# Patient Record
Sex: Male | Born: 2000
Health system: Southern US, Community
[De-identification: ages and names within clinical notes are randomized; demographics above are authoritative.]

## PROBLEM LIST (undated history)

## (undated) DIAGNOSIS — D6851 Activated protein C resistance: Secondary | ICD-10-CM

## (undated) DIAGNOSIS — B2790 Infectious mononucleosis, unspecified without complication: Secondary | ICD-10-CM

## (undated) HISTORY — DX: Infectious mononucleosis, unspecified without complication: B27.90

## (undated) HISTORY — PX: WISDOM TOOTH EXTRACTION: SHX21

---
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
## 2018-03-11 ENCOUNTER — Telehealth: Payer: Self-pay | Admitting: Physician Assistant

## 2018-03-11 NOTE — Telephone Encounter (Signed)
See note

## 2018-03-11 NOTE — Telephone Encounter (Signed)
Copied from CRM [phone]. Topic: Quick Communication - See Telephone Encounter >> Mar 11, 2018  3:57 PM Eston Mouldavis, Nnaemeka Samson B wrote: CRM for notification. See Telephone encounter for: 03/11/18.  Pt would like to discuss lab results form July of 2018, he thinks there was a miscommunication.

## 2018-03-12 NOTE — Telephone Encounter (Signed)
Pt called back, wanted to have clarification on lab results from last July regarding wart that was removed. Told pt the biopsy showed  a "verrucous epidermal hyperplasia" which is a common name for a wart and was HPV Negative. Pt verbalized understanding and said he had a physical were he is and was told he had HPV. Told pt the wart we removed was HPV negative. Pt verbalized understanding.

## 2018-03-12 NOTE — Telephone Encounter (Signed)
Left message on voicemail to call office.  

## 2018-04-21 DIAGNOSIS — B2790 Infectious mononucleosis, unspecified without complication: Secondary | ICD-10-CM

## 2018-04-21 HISTORY — DX: Infectious mononucleosis, unspecified without complication: B27.90

## 2018-04-30 DIAGNOSIS — L700 Acne vulgaris: Secondary | ICD-10-CM | POA: Diagnosis not present

## 2018-04-30 DIAGNOSIS — B078 Other viral warts: Secondary | ICD-10-CM | POA: Diagnosis not present

## 2018-04-30 DIAGNOSIS — A630 Anogenital (venereal) warts: Secondary | ICD-10-CM | POA: Diagnosis not present

## 2018-05-02 DIAGNOSIS — H6980 Other specified disorders of Eustachian tube, unspecified ear: Secondary | ICD-10-CM | POA: Diagnosis not present

## 2018-05-02 DIAGNOSIS — J039 Acute tonsillitis, unspecified: Secondary | ICD-10-CM | POA: Diagnosis not present

## 2018-05-02 DIAGNOSIS — B2790 Infectious mononucleosis, unspecified without complication: Secondary | ICD-10-CM | POA: Diagnosis not present

## 2018-05-05 ENCOUNTER — Other Ambulatory Visit: Payer: BLUE CROSS/BLUE SHIELD

## 2018-05-05 ENCOUNTER — Ambulatory Visit (INDEPENDENT_AMBULATORY_CARE_PROVIDER_SITE_OTHER): Payer: BLUE CROSS/BLUE SHIELD | Admitting: Physician Assistant

## 2018-05-05 ENCOUNTER — Encounter: Payer: Self-pay | Admitting: Physician Assistant

## 2018-05-05 VITALS — BP 120/80 | HR 105 | Temp 98.4°F | Ht 71.0 in | Wt 169.2 lb

## 2018-05-05 DIAGNOSIS — J029 Acute pharyngitis, unspecified: Secondary | ICD-10-CM

## 2018-05-05 DIAGNOSIS — R799 Abnormal finding of blood chemistry, unspecified: Secondary | ICD-10-CM

## 2018-05-05 LAB — CBC WITH DIFFERENTIAL/PLATELET
BASOS ABS: 0 10*3/uL (ref 0.0–0.1)
Basophils Relative: 0.3 % (ref 0.0–3.0)
EOS ABS: 0 10*3/uL (ref 0.0–0.7)
Eosinophils Relative: 0.1 % (ref 0.0–5.0)
HCT: 48.6 % (ref 36.0–49.0)
Hemoglobin: 17.5 g/dL — ABNORMAL HIGH (ref 12.0–16.0)
LYMPHS ABS: 6.2 10*3/uL — AB (ref 0.7–4.0)
Lymphocytes Relative: 46.3 % (ref 24.0–48.0)
MCHC: 36.1 g/dL (ref 31.0–37.0)
MCV: 88.7 fl (ref 78.0–98.0)
MONO ABS: 0.8 10*3/uL (ref 0.1–1.0)
Monocytes Relative: 6 % (ref 3.0–12.0)
NEUTROS PCT: 47.3 % (ref 43.0–71.0)
Neutro Abs: 6.3 10*3/uL (ref 1.4–7.7)
Platelets: 247 10*3/uL (ref 150.0–575.0)
RBC: 5.48 Mil/uL (ref 3.80–5.70)
RDW: 12.6 % (ref 11.4–15.5)
WBC: 13.4 10*3/uL (ref 4.5–13.5)

## 2018-05-05 MED ORDER — PREDNISONE 20 MG PO TABS: 40.0000 mg | ORAL_TABLET | Freq: Every day | ORAL | 0 refills | Status: DC

## 2018-05-05 MED ORDER — CLINDAMYCIN HCL 300 MG PO CAPS: 300.0000 mg | ORAL_CAPSULE | Freq: Three times a day (TID) | ORAL | 0 refills | Status: DC

## 2018-05-05 NOTE — Progress Notes (Signed)
Keith Harrison is a 17 y.o. male here for a new problem.  I acted as a Neurosurgeon for [employer], PA-C Corky Mull, LPN  History of Present Illness:   Chief Complaint  Patient presents with  . Sore Throat   Patient is present with mom today.  Sore Throat   This is a new problem. Episode onset: Started 28th of April saw Dr. on school campus given Amoxicillin and Flonase . Took antibiotic for few days and was not getting any better and went to Urgent Care on Sunday 5/12 and was diagnosed with Mono. The problem has been gradually worsening. The maximum temperature recorded prior to his arrival was 100.4 - 100.9 F. The fever has been present for 3 to 4 days. The pain is at a severity of 4/10. The pain is moderate. Associated symptoms include congestion (Nasal, clear drainage), coughing, a hoarse voice, a plugged ear sensation, neck pain and trouble swallowing. Pertinent negatives include no diarrhea, headaches or shortness of breath. He has had exposure to mono. He has tried acetaminophen and cool liquids (Magic Mouthwash, Prednisone) for the symptoms. The treatment provided moderate relief.   He is concerned that his symptoms are not improving. His throat is very painful and he is having to eat soft foods.  Past Medical History:  Diagnosis Date  . Factor 5 Leiden mutation, heterozygous [facility])      Social History   Socioeconomic History  . Marital status: Single    Spouse name: Not on file  . Number of children: Not on file  . Years of education: Not on file  . Highest education level: Not on file  Occupational History  . Not on file  Social Needs  . Financial resource strain: Not on file  . Food insecurity:    Worry: Not on file    Inability: Not on file  . Transportation needs:    Medical: Not on file    Non-medical: Not on file  Tobacco Use  . Smoking status: Never Smoker  . Smokeless tobacco: Never Used  Substance and Sexual Activity  . Alcohol use: No  . Drug use:  No  . Sexual activity: Yes    Partners: Female  Lifestyle  . Physical activity:    Days per week: Not on file    Minutes per session: Not on file  . Stress: Not on file  Relationships  . Social connections:    Talks on phone: Not on file    Gets together: Not on file    Attends religious service: Not on file    Active member of club or organization: Not on file    Attends meetings of clubs or organizations: Not on file    Relationship status: Not on file  . Intimate partner violence:    Fear of current or ex partner: Not on file    Emotionally abused: Not on file    Physically abused: Not on file    Forced sexual activity: Not on file  Other Topics Concern  . Not on file  Social History Narrative   Goes to [employer]   Has a sister    Past Surgical History:  Procedure Laterality Date  . WISDOM TOOTH EXTRACTION      History reviewed. No pertinent family history.  No Known Allergies  Current Medications:   Current Outpatient Medications:  .  acetaminophen (TYLENOL) 500 MG tablet, Take 1,000 mg by mouth every 6 (six) hours as needed., Disp: , Rfl:  .  fluticasone (FLONASE) 50 MCG/ACT nasal spray, Place 1 spray into both nostrils daily., Disp: , Rfl:  .  magic mouthwash SOLN, SWISH AND SWALLOW 5 MILLILITERS BY MOUTH 4 TIMES A DAY BEFORE MEALS, Disp: , Rfl: 0 .  clindamycin (CLEOCIN) 300 MG capsule, Take 1 capsule (300 mg total) by mouth 3 (three) times daily., Disp: 30 capsule, Rfl: 0 .  predniSONE (DELTASONE) 20 MG tablet, Take 2 tablets (40 mg total) by mouth daily., Disp: 10 tablet, Rfl: 0   Review of Systems:   Review of Systems  HENT: Positive for congestion (Nasal, clear drainage), hoarse voice and trouble swallowing.   Respiratory: Positive for cough. Negative for shortness of breath.   Gastrointestinal: Negative for diarrhea.  Musculoskeletal: Positive for neck pain.  Neurological: Negative for headaches.    Vitals:   Vitals:   05/05/18 1348  BP: 120/80   Pulse: (!) 105  Temp: 98.4 F (36.9 C)  TempSrc: Oral  SpO2: 96%  Weight: 169 lb 4 oz (76.8 kg)  Height:  (1.803 m)     Body mass index is 23.61 kg/m.  Physical Exam:   Physical Exam  Constitutional: He appears well-developed. He is cooperative.  Non-toxic appearance. He does not have a sickly appearance. He does not appear ill. No distress.  HENT:  Head: Normocephalic and atraumatic.  Right Ear: Tympanic membrane, external ear and ear canal normal. Tympanic membrane is not erythematous, not retracted and not bulging.  Left Ear: Tympanic membrane, external ear and ear canal normal. Tympanic membrane is not erythematous, not retracted and not bulging.  Nose: Nose normal. Right sinus exhibits no maxillary sinus tenderness and no frontal sinus tenderness. Left sinus exhibits no maxillary sinus tenderness and no frontal sinus tenderness.  Mouth/Throat: Uvula is midline and mucous membranes are normal. Posterior oropharyngeal erythema present. No posterior oropharyngeal edema. Tonsils are 3+ on the right. Tonsils are 3+ on the left. Tonsillar exudate.  Eyes: Conjunctivae and lids are normal.  Neck: Trachea normal.  Cardiovascular: Regular rhythm, S1 normal, S2 normal and normal heart sounds. Tachycardia present.  Pulmonary/Chest: Effort normal and breath sounds normal. He has no decreased breath sounds. He has no wheezes. He has no rhonchi. He has no rales.  Lymphadenopathy:    He has cervical adenopathy.  Neurological: He is alert.  Skin: Skin is warm, dry and intact.  Psychiatric: He has a normal mood and affect. His speech is normal and behavior is normal.  Nursing note and vitals reviewed.   Assessment and Plan:    Toby was seen today for sore throat.  Diagnoses and all orders for this visit:  Pharyngitis, unspecified etiology Discussed case with [name]. Will treat exudative tonsillitis with Clindamycin per orders. Push fluids. Increase prednisone to 40 mg  daily x 5 days. Follow-up on Friday for re-evaluation of throat. Follow-up sooner if other concerns. Mom is requesting mono testing -- will draw today as well as CBC w/ diff.  -     CBC with Differential/Platelet -     Epstein-Barr virus VCA antibody panel  Other orders -     clindamycin (CLEOCIN) 300 MG capsule; Take 1 capsule (300 mg total) by mouth 3 (three) times daily. -     predniSONE (DELTASONE) 20 MG tablet; Take 2 tablets (40 mg total) by mouth daily.    . Reviewed expectations re: course of current medical issues. . Discussed self-management of symptoms. . Outlined signs and symptoms indicating need for more acute intervention. . Patient  verbalized understanding and all questions were answered. . See orders for this visit as documented in the electronic medical record. . Patient received an After-Visit Summary.  CMA or LPN served as scribe during this visit. History, Physical, and Plan performed by medical provider. Documentation and orders reviewed and attested to.  Jarold Motto, PA-C

## 2018-05-05 NOTE — Patient Instructions (Addendum)
Take 20 mg prednisone in morning and 20 mg prednisone in afternoon.  Push fluids.  Tylenol for fever if needed.  Start clindamycin antibiotic.   Follow-up with [name] on Friday to re-evaluate your throat.   Please return if you are not improving as expected, or if you have high fevers (>101.5) or difficulty swallowing or worsening productive cough.  Call clinic with questions.  I hope you start feeling better soon!

## 2018-05-06 LAB — EPSTEIN-BARR VIRUS VCA ANTIBODY PANEL
EBV NA IgG: <18 U/mL
EBV VCA IgG: 81.6 U/mL — ABNORMAL HIGH
EBV VCA IgM: >160 U/mL — ABNORMAL HIGH

## 2018-05-06 LAB — PATHOLOGIST SMEAR REVIEW

## 2018-05-10 ENCOUNTER — Encounter: Payer: Self-pay | Admitting: Hematology

## 2018-05-10 ENCOUNTER — Ambulatory Visit: Payer: BLUE CROSS/BLUE SHIELD | Admitting: Physician Assistant

## 2018-05-10 ENCOUNTER — Telehealth: Payer: Self-pay | Admitting: Hematology

## 2018-05-10 ENCOUNTER — Encounter: Payer: Self-pay | Admitting: Physician Assistant

## 2018-05-10 VITALS — BP 110/64 | HR 65 | Temp 97.8°F | Ht 71.0 in | Wt 170.2 lb

## 2018-05-10 DIAGNOSIS — D582 Other hemoglobinopathies: Secondary | ICD-10-CM | POA: Diagnosis not present

## 2018-05-10 DIAGNOSIS — D6851 Activated protein C resistance: Secondary | ICD-10-CM | POA: Diagnosis not present

## 2018-05-10 DIAGNOSIS — B2790 Infectious mononucleosis, unspecified without complication: Secondary | ICD-10-CM | POA: Diagnosis not present

## 2018-05-10 NOTE — Telephone Encounter (Signed)
Referral from Dr. Bufford Buttner at [facility] for a dx of Factor V Leiden.   Tc has been made to the pt to see Dr. Candise Che on 5/29 at 10am. Letter mailed to the pt.

## 2018-05-10 NOTE — Patient Instructions (Signed)
You will be contacted about your appointment to hematology.

## 2018-05-10 NOTE — Progress Notes (Signed)
Keith Harrison is a 17 y.o. male is here to discuss: Sore throat  I acted as a Neurosurgeon for [employer], PA-C Corky Mull, LPN  History of Present Illness:   Chief Complaint  Patient presents with  . Sore Throat    Sore Throat   Chronicity: Pt here to follow up, feeling much better. The problem has been resolved. There has been no fever. The pain is at a severity of 0/10. Associated symptoms comments: Fatigue. He has tried acetaminophen (Magic Mouthwash used few times) for the symptoms. The treatment provided significant relief.   We reviewed his labs today from his prior visit.  His labs did confirm that he has an active mono infection.  He also has an elevated hemoglobin it is currently 17.5 he reports that he is not a smoker.  He does have a history of factor V.  States that his factor V was diagnosed when he was very young and he does not have any current follow-up or management for this chronic medical condition.  There are no preventive care reminders to display for this patient.  Past Medical History:  Diagnosis Date  . Factor 5 Leiden mutation, heterozygous [facility])      Social History   Socioeconomic History  . Marital status: Single    Spouse name: Not on file  . Number of children: Not on file  . Years of education: Not on file  . Highest education level: Not on file  Occupational History  . Not on file  Social Needs  . Financial resource strain: Not on file  . Food insecurity:    Worry: Not on file    Inability: Not on file  . Transportation needs:    Medical: Not on file    Non-medical: Not on file  Tobacco Use  . Smoking status: Never Smoker  . Smokeless tobacco: Never Used  Substance and Sexual Activity  . Alcohol use: No  . Drug use: No  . Sexual activity: Yes    Partners: Female  Lifestyle  . Physical activity:    Days per week: Not on file    Minutes per session: Not on file  . Stress: Not on file  Relationships  . Social connections:      Talks on phone: Not on file    Gets together: Not on file    Attends religious service: Not on file    Active member of club or organization: Not on file    Attends meetings of clubs or organizations: Not on file    Relationship status: Not on file  . Intimate partner violence:    Fear of current or ex partner: Not on file    Emotionally abused: Not on file    Physically abused: Not on file    Forced sexual activity: Not on file  Other Topics Concern  . Not on file  Social History Narrative   Goes to [employer]   Has a sister    Past Surgical History:  Procedure Laterality Date  . WISDOM TOOTH EXTRACTION      History reviewed. No pertinent family history.  PMHx, SurgHx, SocialHx, FamHx, Medications, and Allergies were reviewed in the Visit Navigator and updated as appropriate.   Patient Active Problem List   Diagnosis Date Noted  . Factor 5 Leiden mutation, heterozygous (HCC) 02/13/2017    Social History   Tobacco Use  . Smoking status: Never Smoker  . Smokeless tobacco: Never Used  Substance Use Topics  .  Alcohol use: No  . Drug use: No    Current Medications and Allergies:    Current Outpatient Medications:  .  acetaminophen (TYLENOL) 500 MG tablet, Take 1,000 mg by mouth every 6 (six) hours as needed., Disp: , Rfl:  .  clindamycin (CLEOCIN) 300 MG capsule, Take 1 capsule (300 mg total) by mouth 3 (three) times daily., Disp: 30 capsule, Rfl: 0 .  fluticasone (FLONASE) 50 MCG/ACT nasal spray, Place 1 spray into both nostrils daily., Disp: , Rfl:  .  predniSONE (DELTASONE) 20 MG tablet, Take 2 tablets (40 mg total) by mouth daily., Disp: 10 tablet, Rfl: 0 .  magic mouthwash SOLN, SWISH AND SWALLOW 5 MILLILITERS BY MOUTH 4 TIMES A DAY BEFORE MEALS, Disp: , Rfl: 0  No Known Allergies  Review of Systems   ROS  Negative unless otherwise specified per HPI.  Vitals:   Vitals:   05/10/18 0818  BP: 110/64  Pulse: 65  Temp: 97.8 F (36.6 C)  TempSrc: Oral   SpO2: 97%  Weight: 170 lb 4 oz (77.2 kg)  Height:  (1.803 m)     Body mass index is 23.75 kg/m.   Physical Exam:    Physical Exam  Constitutional: He appears well-developed. He is cooperative.  Non-toxic appearance. He does not have a sickly appearance. He does not appear ill. No distress.  HENT:  Head: Normocephalic and atraumatic.  Right Ear: Tympanic membrane, external ear and ear canal normal. Tympanic membrane is not erythematous, not retracted and not bulging.  Left Ear: Tympanic membrane, external ear and ear canal normal. Tympanic membrane is not erythematous, not retracted and not bulging.  Nose: Nose normal. Right sinus exhibits no maxillary sinus tenderness and no frontal sinus tenderness. Left sinus exhibits no maxillary sinus tenderness and no frontal sinus tenderness.  Mouth/Throat: Uvula is midline and mucous membranes are normal. No posterior oropharyngeal edema or posterior oropharyngeal erythema. Tonsils are 1+ on the right. Tonsils are 1+ on the left.  Eyes: Conjunctivae and lids are normal.  Neck: Trachea normal.  Cardiovascular: Normal rate, regular rhythm, S1 normal, S2 normal and normal heart sounds.  Pulmonary/Chest: Effort normal and breath sounds normal. He has no decreased breath sounds. He has no wheezes. He has no rhonchi. He has no rales.  Lymphadenopathy:    He has no cervical adenopathy.  Neurological: He is alert.  Skin: Skin is warm, dry and intact.  Psychiatric: He has a normal mood and affect. His speech is normal and behavior is normal.  Nursing note and vitals reviewed.  Results for orders placed or performed in visit on 05/05/18  Pathologist smear review  Result Value Ref Range   Path Review     CBC    Component Value Date/Time   WBC 13.4 05/05/2018 1418   RBC 5.48 05/05/2018 1418   HGB 17.5 Repeated and verified X2. (H) 05/05/2018 1418   HCT 48.6 05/05/2018 1418   PLT 247.0 05/05/2018 1418   MCV 88.7 05/05/2018 1418   MCHC  36.1 05/05/2018 1418   RDW 12.6 05/05/2018 1418   LYMPHSABS 6.2 (H) 05/05/2018 1418   MONOABS 0.8 05/05/2018 1418   EOSABS 0.0 05/05/2018 1418   BASOSABS 0.0 05/05/2018 1418      Assessment and Plan:    Myshawn was seen today for sore throat.  Diagnoses and all orders for this visit:  Elevated hemoglobin (HCC) and Factor 5 Leiden mutation, heterozygous [facility]) I reviewed lab results with [name].  Given his  elevated hemoglobin, and history of factor V, we will go ahead and send to hematology for further evaluation.  Patient is agreeable to this. -     Ambulatory referral to Hematology  Infectious mononucleosis without complication, infectious mononucleosis due to unspecified organism His symptoms, in regards to his pharyngitis, are essentially resolved.  He continues to have lingering fatigue.  We discussed need for rest and avoidance of any high impact sports or grueling activities for the next few weeks.  Patient verbalized understanding.  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  Follow-up if symptoms worsen.   . Reviewed expectations re: course of current medical issues. . Discussed self-management of symptoms. . Outlined signs and symptoms indicating need for more acute intervention. . Patient verbalized understanding and all questions were answered. . See orders for this visit as documented in the electronic medical record. . Patient received an After Visit Summary.  CMA or LPN served as scribe during this visit. History, Physical, and Plan performed by medical provider. Documentation and orders reviewed and attested to.  Jarold Motto, PA-C [place] [place] 05/10/2018  Follow-up: No follow-ups on file.

## 2018-05-14 NOTE — Progress Notes (Signed)
HEMATOLOGY/ONCOLOGY CONSULTATION NOTE  Date of Service: 05/19/2018  Patient Care Team: Jarold Motto, [place] as PCP - General (Physician Assistant)  CHIEF COMPLAINTS/PURPOSE OF CONSULTATION:  Factor V Leiden Polycythemia  HISTORY OF PRESENTING ILLNESS:   Keith Harrison is a wonderful 17 y.o. male who has been referred to [name] by Jarold Motto, PA for evaluation and management of heterozygous Factor V Leiden and polycythemia.   He is accompanied today by his mother and step-father. The pt reports that he is doing well overall.   The pt reports being tested for Factor V Leiden when he was an infant and was found to be heterozygous Factor V Leiden. He had recent concerns of SOB for which he saw his PCP Jarold Motto, PA yesterday and was worked up with a CTA Chest. He notes that he recently had infectious mononucleosis and has been more dehydrated. He notes that his mono began around April 28 and resolved about a week ago.   The patient's mother notes that she is heterozygous for Factor V Leiden. She used Lovenox throughout the entire pregnancy and has had clots before and after her pregnancy. She notes oral contraceptive use in the setting of her blood clots, and has not had a clot in 12-14 years since discontinuing estrogen contraceptives.   Of note prior to the patient's visit today, pt has had CT Angio Chest completed on 05/18/18 with results revealing Normal CTA chest. Negative for pulmonary embolism.   Most recent lab results (05/05/18) of CBC  is as follows: all values are WNL except for Hgb at 17.5, Lymphs Abs at 6.2k.   On review of systems, pt reports resolving viral symptoms, and denies skin rashes, pain along the spine, abdominal pains, leg swelling, and any other symptoms.   On PMHx the pt denies blood clots and any significant concerns. On Social Hx the pt denies ever smoking On Family Hx the pt reports maternal Factor V Leiden and blood clots, as well as paternal  grandfather blood clot. The pt reports paternal Factor V Leiden.    MEDICAL HISTORY:  Past Medical History:  Diagnosis Date  . Factor 5 Leiden mutation, heterozygous [facility])     SURGICAL HISTORY: Past Surgical History:  Procedure Laterality Date  . WISDOM TOOTH EXTRACTION      SOCIAL HISTORY: Social History   Socioeconomic History  . Marital status: Single    Spouse name: Not on file  . Number of children: Not on file  . Years of education: Not on file  . Highest education level: Not on file  Occupational History  . Not on file  Social Needs  . Financial resource strain: Not on file  . Food insecurity:    Worry: Not on file    Inability: Not on file  . Transportation needs:    Medical: Not on file    Non-medical: Not on file  Tobacco Use  . Smoking status: Never Smoker  . Smokeless tobacco: Never Used  Substance and Sexual Activity  . Alcohol use: No  . Drug use: No  . Sexual activity: Yes    Partners: Female  Lifestyle  . Physical activity:    Days per week: Not on file    Minutes per session: Not on file  . Stress: Not on file  Relationships  . Social connections:    Talks on phone: Not on file    Gets together: Not on file    Attends religious service: Not on file  Active member of club or organization: Not on file    Attends meetings of clubs or organizations: Not on file    Relationship status: Not on file  . Intimate partner violence:    Fear of current or ex partner: Not on file    Emotionally abused: Not on file    Physically abused: Not on file    Forced sexual activity: Not on file  Other Topics Concern  . Not on file  Social History Narrative   Goes to [employer]   Has a sister    FAMILY HISTORY: History reviewed. No pertinent family history.  ALLERGIES:  has No Known Allergies.  MEDICATIONS:  No current outpatient medications on file.   No current facility-administered medications for this visit.     REVIEW OF SYSTEMS:    10  Point review of Systems was done is negative except as noted above.  PHYSICAL EXAMINATION:  . Vitals:   05/19/18 1118  BP: 126/77  Pulse: 69  Resp: 16  Temp: 98.5 F (36.9 C)  SpO2: 99%   Filed Weights   05/19/18 1118  Weight: 170 lb 6.4 oz (77.3 kg)   .Body mass index is 23.77 kg/m.  GENERAL:alert, in no acute distress and comfortable SKIN: no acute rashes, no significant lesions EYES: conjunctiva are pink and non-injected, sclera anicteric OROPHARYNX: MMM, no exudates, no oropharyngeal erythema or ulceration NECK: supple, no JVD LYMPH:  no palpable lymphadenopathy in the cervical, axillary or inguinal regions LUNGS: clear to auscultation b/l with normal respiratory effort HEART: regular rate & rhythm ABDOMEN:  normoactive bowel sounds , non tender, not distended. Extremity: no pedal edema PSYCH: alert & oriented x 3 with fluent speech NEURO: no focal motor/sensory deficits  LABORATORY DATA:  I have reviewed the data as listed  . CBC Latest Ref Rng & Units 05/21/2018 05/05/2018  WBC 4.0 - 10.3 K/uL 4.9 13.4  Hemoglobin 13.0 - 17.1 g/dL 16.1 09.6 Repeated and verified X2.(H)  Hematocrit 38.4 - 49.9 % 44.0 48.6  Platelets 140 - 400 K/uL 143 247.0   Component     Latest Ref Rng & Units 05/21/2018  Retic Ct Pct     0.8 - 1.8 % 0.7 (L)  RBC.     4.20 - 5.82 MIL/uL 5.00  Retic Count, Absolute     34.8 - 93.9 K/uL 35.0  Antithrombin Activity     75 - 120 % 94  Act.Prt.C Resist.     2.2 - 3.5 ratio 1.9 (L)  Protein S, Total     60 - 150 % 89  Protein S-Functional     63 - 140 % 93  Protein C, Total     60 - 150 % 88  Protein C-Functional     73 - 180 % 88    RADIOGRAPHIC STUDIES: I have personally reviewed the radiological images as listed and agreed with the findings in the report. Ct Angio Chest W/cm &/or Wo Cm  Result Date: 05/18/2018 CLINICAL DATA:  Short of breath.  Factor 5 Leiden mutation EXAM: CT ANGIOGRAPHY CHEST WITH CONTRAST TECHNIQUE:  Multidetector CT imaging of the chest was performed using the standard protocol during bolus administration of intravenous contrast. Multiplanar CT image reconstructions and MIPs were obtained to evaluate the vascular anatomy. CONTRAST:  80mL ISOVUE-370 IOPAMIDOL (ISOVUE-370) INJECTION 76% COMPARISON:  None. FINDINGS: Cardiovascular: Negative for pulmonary embolism. Normal pulmonary arteries. Thoracic aorta normal. Heart size normal. Mediastinum/Nodes: Negative for mass or adenopathy Lungs/Pleura: Lungs are clear.  No infiltrate effusion or mass. Upper Abdomen: Negative Musculoskeletal: Negative Review of the MIP images confirms the above findings. IMPRESSION: Normal CTA chest.  Negative for pulmonary embolism. Electronically Signed   By: [name] M.D.   On: 05/18/2018 15:07    ASSESSMENT & PLAN:  17 y.o. male with   1. Factor V Leiden - likely heterozygous factor V leiden mutation as per patients mother report - no result avaialble. Mildly reduced Activated protein C resistance consistent with this diagnosis. Unknown FHx on dad's side of family PLAN -hereditary thrombophilia workup as noted - low APC resistance  -pending FVL and PGM studies --will f/u to confirm heterozygous FVL mutation -Discussed prophylactic lifestyle modifications including staying well hydrated, avoiding excessive caffeine or alcohol consumption when on long travels, and using compression socks on long travels. Avoid testosterone replacement, high-dose steroids, and smoking.  -assuming his reported h/o heterozygous FVL mutation is accurate discussed the significance of this findings and the hereditary transmission of this. -no previous VTE - no indication for prophylactic anticoagulation at this time. -discussed avoidance of trigger factors for VTE -Continue follow up with PCP  2. Polycythemia - hgb 17.5 --- likely secondary to hemoconcentration from dehydration in the setitng Mononucleosis -prt labs today show normal  hgb of 15.7 and normal HCT of 44 -recommended good po hydration -no additional w/u indicated for this  Labs today  RTC with Dr Candise Che as needed based on labs    All of the patients questions were answered with apparent satisfaction. The patient knows to call the clinic with any problems, questions or concerns.  The toal time spent in the appt was 45 minutes and more than 50% was on counseling and direct patient cares.    Wyvonnia Lora MD MS AAHIVMS [facility] Hematology/Oncology Physician [facility]  (Office):       [phone] (Work cell):  [phone] (Fax):           [phone]  05/19/2018 11:56 AM  I, Marcelline Mates, am acting as a Neurosurgeon for Dr Candise Che.   .I have reviewed the above documentation for accuracy and completeness, and I agree with the above. Johney Maine MD MS

## 2018-05-18 ENCOUNTER — Ambulatory Visit (INDEPENDENT_AMBULATORY_CARE_PROVIDER_SITE_OTHER): Payer: BLUE CROSS/BLUE SHIELD | Admitting: Physician Assistant

## 2018-05-18 ENCOUNTER — Encounter: Payer: Self-pay | Admitting: Physician Assistant

## 2018-05-18 ENCOUNTER — Ambulatory Visit (INDEPENDENT_AMBULATORY_CARE_PROVIDER_SITE_OTHER)
Admission: RE | Admit: 2018-05-18 | Discharge: 2018-05-18 | Disposition: A | Discharge status: Home / Self Care | Payer: BLUE CROSS/BLUE SHIELD | Source: Ambulatory Visit | Attending: Physician Assistant | Admitting: Physician Assistant

## 2018-05-18 ENCOUNTER — Other Ambulatory Visit (HOSPITAL_COMMUNITY)
Admission: RE | Admit: 2018-05-18 | Discharge: 2018-05-18 | Disposition: A | Discharge status: Home / Self Care | Payer: BLUE CROSS/BLUE SHIELD | Source: Ambulatory Visit | Attending: Physician Assistant | Admitting: Physician Assistant

## 2018-05-18 VITALS — BP 118/92 | HR 119 | Temp 98.4°F | Ht 71.0 in | Wt 166.2 lb

## 2018-05-18 DIAGNOSIS — D6851 Activated protein C resistance: Secondary | ICD-10-CM | POA: Diagnosis not present

## 2018-05-18 DIAGNOSIS — Z202 Contact with and (suspected) exposure to infections with a predominantly sexual mode of transmission: Secondary | ICD-10-CM | POA: Insufficient documentation

## 2018-05-18 DIAGNOSIS — L989 Disorder of the skin and subcutaneous tissue, unspecified: Secondary | ICD-10-CM

## 2018-05-18 DIAGNOSIS — R0602 Shortness of breath: Secondary | ICD-10-CM

## 2018-05-18 MED ORDER — IOPAMIDOL (ISOVUE-370) INJECTION 76%
80.0000 mL | Freq: Once | INTRAVENOUS | Status: AC | PRN
  Administered 2018-05-18: 80 mL via INTRAVENOUS

## 2018-05-18 NOTE — Progress Notes (Signed)
Keith Harrison is a 17 y.o. male here for a new problem.  I acted as a Neurosurgeon for [employer], PA-C [employer], LPN  History of Present Illness:   Chief Complaint  Patient presents with  . personal issue    HPI   Patient presents to discuss 2 issues today: #1 Shortness of breath-patient reports that over the past week he has had worsening shortness of breath.  It is worse with physical activity.  He denies chest pain.  He does note some increased anxiety recently.  He has recently been dealing with mono.  At one of our recent visits we obtained blood work, it was found that he had an elevated hemoglobin.  He has an appointment with hematology tomorrow.  He states that at the beginning of this shortness of breath, he had some calf  aching on the left side.  He has a significant past medical history of factor V Leiden.  He also reports that his grandfather has had 2 blood clots.  He is unsure of his mother's hematology history, however he reports that she takes Lovenox daily.  He has no personal prior history of blood clots.  He has been resting more with his recent mono, but has not been "immobilized".  Denies hemoptysis.  #2 Bumps in the groin-patient reports that he has a small bump in his right groin that he is noticed a few days ago.  He has had no discharge from his penis.  He has had sex twice over the past year, and denies any other symptoms.  He said that he has been reading things on the Internet, and is concerned that he has Staphylococcus aureus.  Of note, he recently has completed multiple rounds of antibiotics for recent upper respiratory infection.  Most recently he completed an entire course of clindamycin.  He denies any discharge from the lesion or his penis.  He would like to be tested for STDs today.   Past Medical History:  Diagnosis Date  . Factor 5 Leiden mutation, heterozygous [facility])      Social History   Socioeconomic History  . Marital status: Single     Spouse name: Not on file  . Number of children: Not on file  . Years of education: Not on file  . Highest education level: Not on file  Occupational History  . Not on file  Social Needs  . Financial resource strain: Not on file  . Food insecurity:    Worry: Not on file    Inability: Not on file  . Transportation needs:    Medical: Not on file    Non-medical: Not on file  Tobacco Use  . Smoking status: Never Smoker  . Smokeless tobacco: Never Used  Substance and Sexual Activity  . Alcohol use: No  . Drug use: No  . Sexual activity: Yes    Partners: Female  Lifestyle  . Physical activity:    Days per week: Not on file    Minutes per session: Not on file  . Stress: Not on file  Relationships  . Social connections:    Talks on phone: Not on file    Gets together: Not on file    Attends religious service: Not on file    Active member of club or organization: Not on file    Attends meetings of clubs or organizations: Not on file    Relationship status: Not on file  . Intimate partner violence:    Fear of current  or ex partner: Not on file    Emotionally abused: Not on file    Physically abused: Not on file    Forced sexual activity: Not on file  Other Topics Concern  . Not on file  Social History Narrative   Goes to [employer]   Has a sister    Past Surgical History:  Procedure Laterality Date  . WISDOM TOOTH EXTRACTION      History reviewed. No pertinent family history.  No Known Allergies  Current Medications:   Current Outpatient Medications:  .  acetaminophen (TYLENOL) 500 MG tablet, Take 1,000 mg by mouth every 6 (six) hours as needed., Disp: , Rfl:  .  fluticasone (FLONASE) 50 MCG/ACT nasal spray, Place 1 spray into both nostrils daily., Disp: , Rfl:    Review of Systems:   ROS  Negative unless otherwise specified per HPI.   Vitals:   Vitals:   05/18/18 1121  BP: (!) 118/92  Pulse: (!) 119  Temp: 98.4 F (36.9 C)  TempSrc: Oral  SpO2: 98%   Weight: 166 lb 4 oz (75.4 kg)  Height:  (1.803 m)     Body mass index is 23.19 kg/m.  Physical Exam:   Physical Exam  Constitutional: He appears well-developed. He is cooperative.  Non-toxic appearance. He does not have a sickly appearance. He does not appear ill. No distress.  Cardiovascular: Regular rhythm, S1 normal, S2 normal, normal heart sounds and normal pulses. Tachycardia present.  No LE edema  Pulmonary/Chest: Effort normal and breath sounds normal.  Genitourinary:     Neurological: He is alert. GCS eye subscore is 4. GCS verbal subscore is 5. GCS motor subscore is 6.  Skin: Skin is warm, dry and intact.  Psychiatric: He has a normal mood and affect. His speech is normal and behavior is normal.  Nursing note and vitals reviewed.  CLINICAL DATA:  Short of breath.  Factor 5 Leiden mutation  EXAM: CT ANGIOGRAPHY CHEST WITH CONTRAST  TECHNIQUE: Multidetector CT imaging of the chest was performed using the standard protocol during bolus administration of intravenous contrast. Multiplanar CT image reconstructions and MIPs were obtained to evaluate the vascular anatomy.  CONTRAST:  80mL ISOVUE-370 IOPAMIDOL (ISOVUE-370) INJECTION 76%  COMPARISON:  None.  FINDINGS: Cardiovascular: Negative for pulmonary embolism. Normal pulmonary arteries. Thoracic aorta normal. Heart size normal.  Mediastinum/Nodes: Negative for mass or adenopathy  Lungs/Pleura: Lungs are clear.  No infiltrate effusion or mass.  Upper Abdomen: Negative  Musculoskeletal: Negative  Review of the MIP images confirms the above findings.  IMPRESSION: Normal CTA chest.  Negative for pulmonary embolism.   Electronically Signed   By: [name] M.D.   On: 05/18/2018 15:07  Assessment and Plan:    [name] was seen today for personal issue.  Diagnoses and all orders for this visit:  SOB (shortness of breath) and Factor 5 Leiden mutation, heterozygous [name]) Discussed  patient with [name]. Given risk factors, and current symptoms, will order CT angio to r/o pulmonary embolism.  CT angios was negative for PE.  Follow-up with [name] if symptoms worsen or do not improve, I do think that his anxiety is contributing to his shortness of breath.  He is seeing hematology tomorrow. -     CT Angio Chest W/Cm &/Or Wo Cm; Future  Possible exposure to STD and Bumps on skin I recommended that he practice safe sex.  Abstain from sex until STI testing has returned.  I do not think that he  needs an antibiotic for the small bump in his groin, continue to monitor this and keep [name] posted on symptoms.  Patient verbalized understanding to plan. -     Urine cytology ancillary only -     RPR -     HIV antibody  . Reviewed expectations re: course of current medical issues. . Discussed self-management of symptoms. . Outlined signs and symptoms indicating need for more acute intervention. . Patient verbalized understanding and all questions were answered. . See orders for this visit as documented in the electronic medical record. . Patient received an After-Visit Summary.  CMA or LPN served as scribe during this visit. History, Physical, and Plan performed by medical provider. Documentation and orders reviewed and attested to.  Jarold Motto, PA-C

## 2018-05-18 NOTE — Patient Instructions (Addendum)
It was great to see you!  We will contact you with your lab results. I recommend refraining from sex until your lab results are back.  NO SOLIDS AFTER 12:30.  May have clear liquids.  If you develop any chest pain or worsening shortness of breath prior to the appointment, please go to the ER.

## 2018-05-19 ENCOUNTER — Inpatient Hospital Stay: Payer: BLUE CROSS/BLUE SHIELD | Attending: Hematology | Admitting: Hematology

## 2018-05-19 ENCOUNTER — Encounter: Payer: Self-pay | Admitting: Hematology

## 2018-05-19 ENCOUNTER — Telehealth: Payer: Self-pay | Admitting: Hematology

## 2018-05-19 VITALS — BP 126/77 | HR 69 | Temp 98.5°F | Resp 16 | Ht 71.0 in | Wt 170.4 lb

## 2018-05-19 DIAGNOSIS — D6851 Activated protein C resistance: Secondary | ICD-10-CM | POA: Diagnosis not present

## 2018-05-19 DIAGNOSIS — D582 Other hemoglobinopathies: Secondary | ICD-10-CM | POA: Insufficient documentation

## 2018-05-19 DIAGNOSIS — D751 Secondary polycythemia: Secondary | ICD-10-CM | POA: Diagnosis not present

## 2018-05-19 DIAGNOSIS — R0602 Shortness of breath: Secondary | ICD-10-CM | POA: Diagnosis not present

## 2018-05-19 LAB — URINE CYTOLOGY ANCILLARY ONLY
Chlamydia: NEGATIVE
Neisseria Gonorrhea: NEGATIVE
TRICH (WINDOWPATH): NEGATIVE

## 2018-05-19 LAB — HIV ANTIBODY (ROUTINE TESTING W REFLEX): HIV 1&2 Ab, 4th Generation: NONREACTIVE

## 2018-05-19 LAB — RPR: RPR Ser Ql: NONREACTIVE

## 2018-05-19 NOTE — Telephone Encounter (Signed)
Scheduled appt per pt preference - per 5/29 los - pt is aware of appt date and time.

## 2018-05-21 ENCOUNTER — Inpatient Hospital Stay: Payer: BLUE CROSS/BLUE SHIELD

## 2018-05-21 DIAGNOSIS — D582 Other hemoglobinopathies: Secondary | ICD-10-CM

## 2018-05-21 DIAGNOSIS — R0602 Shortness of breath: Secondary | ICD-10-CM | POA: Diagnosis not present

## 2018-05-21 DIAGNOSIS — D6851 Activated protein C resistance: Secondary | ICD-10-CM | POA: Diagnosis not present

## 2018-05-21 DIAGNOSIS — D751 Secondary polycythemia: Secondary | ICD-10-CM | POA: Diagnosis not present

## 2018-05-21 LAB — CBC WITH DIFFERENTIAL/PLATELET
BASOS PCT: 0 %
Basophils Absolute: 0 10*3/uL (ref 0.0–0.1)
Eosinophils Absolute: 0.1 10*3/uL (ref 0.0–0.5)
Eosinophils Relative: 2 %
HEMATOCRIT: 44 % (ref 38.4–49.9)
Hemoglobin: 15.7 g/dL (ref 13.0–17.1)
LYMPHS PCT: 47 %
Lymphs Abs: 2.3 10*3/uL (ref 0.9–3.3)
MCH: 31.4 pg (ref 27.2–33.4)
MCHC: 35.7 g/dL (ref 32.0–36.0)
MCV: 88 fL (ref 79.3–98.0)
Monocytes Absolute: 0.2 10*3/uL (ref 0.1–0.9)
Monocytes Relative: 4 %
NEUTROS ABS: 2.3 10*3/uL (ref 1.5–6.5)
NEUTROS PCT: 47 %
PLATELETS: 143 10*3/uL (ref 140–400)
RBC: 5 MIL/uL (ref 4.20–5.82)
RDW: 12.1 % (ref 11.0–14.6)
WBC: 4.9 10*3/uL (ref 4.0–10.3)

## 2018-05-21 LAB — RETICULOCYTES
RBC.: 5 MIL/uL (ref 4.20–5.82)
RETIC COUNT ABSOLUTE: 35 10*3/uL (ref 34.8–93.9)
Retic Ct Pct: 0.7 % — ABNORMAL LOW (ref 0.8–1.8)

## 2018-05-21 LAB — ANTITHROMBIN III: ANTITHROMB III FUNC: 94 % (ref 75–120)

## 2018-05-23 ENCOUNTER — Encounter: Payer: Self-pay | Admitting: Physician Assistant

## 2018-05-24 LAB — PROTEIN C ACTIVITY: PROTEIN C ACTIVITY: 88 % (ref 73–180)

## 2018-05-24 LAB — ACTIVATED PROTEIN C RESISTANCE: Activated Protein C Resistance: 1.9 ratio — ABNORMAL LOW (ref 2.2–3.5)

## 2018-05-24 LAB — PROTEIN S ACTIVITY: PROTEIN S ACTIVITY: 93 % (ref 63–140)

## 2018-05-24 LAB — PROTEIN S, TOTAL: Protein S Ag, Total: 89 % (ref 60–150)

## 2018-05-25 LAB — PROTEIN C, TOTAL: Protein C, Total: 88 % (ref 60–150)

## 2018-05-26 LAB — FACTOR 5 LEIDEN

## 2018-05-27 LAB — PROTHROMBIN GENE MUTATION

## 2018-05-28 ENCOUNTER — Encounter: Payer: Self-pay | Admitting: Hematology

## 2018-06-11 ENCOUNTER — Encounter: Payer: Self-pay | Admitting: Physician Assistant

## 2018-06-11 ENCOUNTER — Ambulatory Visit: Payer: BLUE CROSS/BLUE SHIELD | Admitting: Physician Assistant

## 2018-06-11 VITALS — BP 110/80 | HR 112 | Temp 98.4°F | Ht 71.0 in | Wt 173.4 lb

## 2018-06-11 DIAGNOSIS — R140 Abdominal distension (gaseous): Secondary | ICD-10-CM

## 2018-06-11 NOTE — Progress Notes (Signed)
Keith Harrison is a 17 y.o. male here for a new problem.  I acted as a Neurosurgeon for [employer], LPN Keith Mull, LPN  History of Present Illness:   Chief Complaint  Patient presents with  . Bloated    GI Problem  Primary symptoms do not include fever, abdominal pain, nausea, vomiting, diarrhea, melena or dysuria. Episode onset: Started on Sunday. The onset was sudden. The problem has not changed since onset. The illness is also significant for bloating. Associated symptoms comments: C/o having to use the bathroom 15-20 minutes after eating and stools are loose, not diarrhea. Denies cramps..   Hanging out with a friend on Saturday, had leftover chili and [employer]. No ETOH.  He was on minocycline for acne, and stopped it on Monday because he thought it was contributing to his symptoms.  He is also been on other antibiotics recently for upper respiratory infection.  He at one point thought he might be lactose intolerant, so he has been avoiding dairy this week and has not noticed any change in symptoms.  Denies pain.  Feels full.  Has had some increased gas.  He has not been to the gym and over 1 month.  He has been limiting himself due to his recent bout of mono.  He is normally very active, mom is present today and confirms that he has been more anxious because he has not been able to go to the gym.   Wt Readings from Last 5 Encounters:  06/11/18 173 lb 6.1 oz (78.6 kg) (78 %, Z= 0.77)*  05/19/18 170 lb 6.4 oz (77.3 kg) (75 %, Z= 0.69)*  05/18/18 166 lb 4 oz (75.4 kg) (71 %, Z= 0.55)*  05/10/18 170 lb 4 oz (77.2 kg) (75 %, Z= 0.68)*  05/05/18 169 lb 4 oz (76.8 kg) (74 %, Z= 0.65)*   * Growth percentiles are based on CDC (Boys, 2-20 Years) data.      Past Medical History:  Diagnosis Date  . Factor 5 Leiden mutation, heterozygous [facility])      Social History   Socioeconomic History  . Marital status: Single    Spouse name: Not on file  . Number of children: Not on file   . Years of education: Not on file  . Highest education level: Not on file  Occupational History  . Not on file  Social Needs  . Financial resource strain: Not on file  . Food insecurity:    Worry: Not on file    Inability: Not on file  . Transportation needs:    Medical: Not on file    Non-medical: Not on file  Tobacco Use  . Smoking status: Never Smoker  . Smokeless tobacco: Never Used  Substance and Sexual Activity  . Alcohol use: No  . Drug use: No  . Sexual activity: Yes    Partners: Female  Lifestyle  . Physical activity:    Days per week: Not on file    Minutes per session: Not on file  . Stress: Not on file  Relationships  . Social connections:    Talks on phone: Not on file    Gets together: Not on file    Attends religious service: Not on file    Active member of club or organization: Not on file    Attends meetings of clubs or organizations: Not on file    Relationship status: Not on file  . Intimate partner violence:    Fear of current or ex  partner: Not on file    Emotionally abused: Not on file    Physically abused: Not on file    Forced sexual activity: Not on file  Other Topics Concern  . Not on file  Social History Narrative   Goes to [employer]   Has a sister    Past Surgical History:  Procedure Laterality Date  . WISDOM TOOTH EXTRACTION      History reviewed. No pertinent family history.  No Known Allergies  Current Medications:  No current outpatient medications on file.   Review of Systems:   Review of Systems  Constitutional: Negative for fever.  Gastrointestinal: Positive for bloating. Negative for abdominal pain, diarrhea, melena, nausea and vomiting.  Genitourinary: Negative for dysuria.    Vitals:   Vitals:   06/11/18 1524  BP: 110/80  Pulse: (!) 112  Temp: 98.4 F (36.9 C)  TempSrc: Oral  SpO2: 98%  Weight: 173 lb 6.1 oz (78.6 kg)  Height: 5\' 11"  (1.803 m)     Body mass index is 24.18 kg/m.  Physical Exam:    Physical Exam  Constitutional: He appears well-developed. He is cooperative.  Non-toxic appearance. He does not have a sickly appearance. He does not appear ill. No distress.  Cardiovascular: Regular rhythm, S1 normal, S2 normal, normal heart sounds and normal pulses. Tachycardia present.  No LE edema  Pulmonary/Chest: Effort normal and breath sounds normal.  Abdominal: Normal appearance and bowel sounds are normal. There is no tenderness. There is no rigidity, no rebound, no guarding, no tenderness at McBurney's point and negative Murphy's sign.  Neurological: He is alert. GCS eye subscore is 4. GCS verbal subscore is 5. GCS motor subscore is 6.  Skin: Skin is warm, dry and intact.  Psychiatric: He has a normal mood and affect. His speech is normal and behavior is normal.  Nursing note and vitals reviewed.   Assessment and Plan:    Keith Harrison was seen today for bloated.  Diagnoses and all orders for this visit:  Bloating   No red flags on my exam today.  I do think this is a multifactorial issue.  I think he is anxious, and due to his more recent sedentary lifestyle, he has been having changes in his bowels.  Also he has been on multiple rounds of antibiotics, so I do feel as though he would benefit from taking a probiotic.  We also considered taking an over-the-counter acid reducer such as Prilosec or Zantac to help with any gastritis he might be feeling.  I also recommended that he avoid any triggering foods such as dairy.  If symptoms do not improve, or if they worsen he is to follow-up with us.  I did discuss discussed that he is able to resume physical activity however I do recommend avoiding all contact sports for at least one more month.  Patient and mother verbalized understanding to plan.  . Reviewed expectations re: course of current medical issues. . Discussed self-management of symptoms. . Outlined signs and symptoms indicating need for more acute intervention. . Patient  verbalized understanding and all questions were answered. . See orders for this visit as documented in the electronic medical record. . Patient received an After-Visit Summary.  CMA or LPN served as scribe during this visit. History, Physical, and Plan performed by medical provider. Documentation and orders reviewed and attested to.   Jarold MottoSamantha Darnesha Diloreto, PA-C

## 2018-06-18 ENCOUNTER — Ambulatory Visit (INDEPENDENT_AMBULATORY_CARE_PROVIDER_SITE_OTHER): Payer: BLUE CROSS/BLUE SHIELD | Admitting: *Deleted

## 2018-06-18 ENCOUNTER — Encounter: Payer: Self-pay | Admitting: *Deleted

## 2018-06-18 DIAGNOSIS — Z23 Encounter for immunization: Secondary | ICD-10-CM | POA: Diagnosis not present

## 2018-06-18 NOTE — Progress Notes (Signed)
Per orders of Jarold MottoSamantha Worley, PA-C injection of Bexsero and Hepatitis A Vaccines given by Corky Mullonna Angello Chien, LPN Patient tolerated injections well. Pt told to return in one month for 2nd Bexsero injection. Pt verbalized understanding.

## 2018-06-30 ENCOUNTER — Ambulatory Visit (INDEPENDENT_AMBULATORY_CARE_PROVIDER_SITE_OTHER): Payer: BLUE CROSS/BLUE SHIELD

## 2018-06-30 ENCOUNTER — Encounter: Payer: Self-pay | Admitting: Physician Assistant

## 2018-06-30 ENCOUNTER — Ambulatory Visit: Payer: BLUE CROSS/BLUE SHIELD | Admitting: Physician Assistant

## 2018-06-30 VITALS — BP 122/80 | HR 94 | Temp 98.1°F | Ht 71.0 in | Wt 169.4 lb

## 2018-06-30 DIAGNOSIS — R140 Abdominal distension (gaseous): Secondary | ICD-10-CM

## 2018-06-30 DIAGNOSIS — R4589 Other symptoms and signs involving emotional state: Secondary | ICD-10-CM | POA: Insufficient documentation

## 2018-06-30 DIAGNOSIS — F418 Other specified anxiety disorders: Secondary | ICD-10-CM | POA: Insufficient documentation

## 2018-06-30 DIAGNOSIS — R109 Unspecified abdominal pain: Secondary | ICD-10-CM | POA: Diagnosis not present

## 2018-06-30 LAB — LIPASE: Lipase: 14 U/L (ref 11.0–59.0)

## 2018-06-30 LAB — H. PYLORI ANTIBODY, IGG: H PYLORI IGG: NEGATIVE

## 2018-06-30 NOTE — Progress Notes (Signed)
Keith Harrison is a 17 y.o. male here for a recurrent problem.  I acted as a Neurosurgeon for [employer], PA-C Corky Mull, LPN  History of Present Illness:   Chief Complaint  Patient presents with  . Bloated  . Anxiety   This is patient's second visit for this issue, please see 06/11/18 visit for more information.  GI Problem  The primary symptoms include weight loss (Pt has lost 4 pounds since last visit 6/21). Primary symptoms do not include fever, abdominal pain, nausea or vomiting. Episode onset: Started 2 weeks ago. The onset was sudden (Pt has changed diet and has been doing Gluten Free for about 10 days. Pt still having loose stools. Pt says some days he feels good and someday not. pt says feels full and does not feel like eating at times.). The problem has not changed since onset. The illness is also significant for bloating.   Does not endorse pain states it feels like his stomach is gurgling more often and at times he is having "butterflies." He is endorsing obsessive thoughts about his health.  When reviewing the [facility] Stool Chart he states that he is having "Type 4." He is not taking a probiotic but is trying to eat yogurt. He states that he has significant health anxiety and states that he knows he would feel much better if he just had reassurance.  Denies chest pain or SOB.  GAD 7 : Generalized Anxiety Score 06/30/2018  Nervous, Anxious, on Edge 0  Control/stop worrying 1  Worry too much - different things 1  Trouble relaxing 0  Restless 0  Easily annoyed or irritable 1  Afraid - awful might happen 2  Total GAD 7 Score 5  Anxiety Difficulty Not difficult at all      Past Medical History:  Diagnosis Date  . Factor 5 Leiden mutation, heterozygous [facility])      Social History   Socioeconomic History  . Marital status: Single    Spouse name: Not on file  . Number of children: Not on file  . Years of education: Not on file  . Highest education level: Not on  file  Occupational History  . Not on file  Social Needs  . Financial resource strain: Not on file  . Food insecurity:    Worry: Not on file    Inability: Not on file  . Transportation needs:    Medical: Not on file    Non-medical: Not on file  Tobacco Use  . Smoking status: Never Smoker  . Smokeless tobacco: Never Used  Substance and Sexual Activity  . Alcohol use: No  . Drug use: No  . Sexual activity: Yes    Partners: Female  Lifestyle  . Physical activity:    Days per week: Not on file    Minutes per session: Not on file  . Stress: Not on file  Relationships  . Social connections:    Talks on phone: Not on file    Gets together: Not on file    Attends religious service: Not on file    Active member of club or organization: Not on file    Attends meetings of clubs or organizations: Not on file    Relationship status: Not on file  . Intimate partner violence:    Fear of current or ex partner: Not on file    Emotionally abused: Not on file    Physically abused: Not on file    Forced sexual activity: Not on  file  Other Topics Concern  . Not on file  Social History Narrative   Goes to [employer]   Has a sister    Past Surgical History:  Procedure Laterality Date  . WISDOM TOOTH EXTRACTION      History reviewed. No pertinent family history.  No Known Allergies  Current Medications:  No current outpatient medications on file.   Review of Systems:   Review of Systems  Constitutional: Positive for weight loss (Pt has lost 4 pounds since last visit 6/21). Negative for fever.  Gastrointestinal: Positive for bloating. Negative for abdominal pain, nausea and vomiting.    Vitals:   Vitals:   06/30/18 1023  BP: 122/80  Pulse: 94  Temp: 98.1 F (36.7 C)  TempSrc: Oral  SpO2: 98%  Weight: 169 lb 6.1 oz (76.8 kg)  Height: 5\' 11"  (1.803 m)     Body mass index is 23.62 kg/m.  Physical Exam:   Physical Exam  Constitutional: He appears well-developed. He  is cooperative.  Non-toxic appearance. He does not have a sickly appearance. He does not appear ill. No distress.  Cardiovascular: Normal rate, regular rhythm, S1 normal, S2 normal, normal heart sounds and normal pulses.  No LE edema  Pulmonary/Chest: Effort normal and breath sounds normal.  Abdominal: Normal appearance and bowel sounds are normal. There is no tenderness. There is no rigidity, no rebound, no guarding, no CVA tenderness, no tenderness at McBurney's point and negative Murphy's sign.  Neurological: He is alert. GCS eye subscore is 4. GCS verbal subscore is 5. GCS motor subscore is 6.  Skin: Skin is warm, dry and intact.  Psychiatric: He has a normal mood and affect. His speech is normal and behavior is normal.  Nursing note and vitals reviewed.  Abdominal xray: large stool burden, awaiting official read  Assessment and Plan:    Gerilyn PilgrimJacob was seen today for bloated and anxiety.  Diagnoses and all orders for this visit:  Bloating No red flags on exam. Abdominal xray is suspicious for constipation. Discussed miralax regimen, recommended increasing fiber and water in diet. Also check lipase and H pylori. I discussed with patient that if he needs further reassurance, I will send him to GI. He declined this for now. -     H. pylori antibody, IgG -     DG Abd 2 Views; Future -     Lipase  Anxiety about health Offered reassurance. Also offered prn medication for anxiety (such as propranolol or buspar) however he declined at this time. Continue to counsel at each visit.  . Reviewed expectations re: course of current medical issues. . Discussed self-management of symptoms. . Outlined signs and symptoms indicating need for more acute intervention. . Patient verbalized understanding and all questions were answered. . See orders for this visit as documented in the electronic medical record. . Patient received an After-Visit Summary.  CMA or LPN served as scribe during this visit.  History, Physical, and Plan performed by medical provider. Documentation and orders reviewed and attested to.   Jarold MottoSamantha Nastasia Kage, PA-C

## 2018-06-30 NOTE — Patient Instructions (Signed)
It was great to see you!  Start with 1 capful of Miralax daily. May increase by 1/2 capfuls as needed.  GETTING TO GOOD BOWEL HEALTH. Irregular bowel habits such as constipation can lead to many problems over time.  Having one soft bowel movement a day is the most important way to prevent further problems.  The anorectal canal is designed to handle stretching and feces to safely manage our ability to get rid of solid waste (feces, poop, stool) out of our body.  BUT, hard constipated stools can act like ripping concrete bricks causing inflamed hemorrhoids, anal fissures, abdominal pain and bloating.     The goal: ONE SOFT BOWEL MOVEMENT A DAY!  To have soft, regular bowel movements:  . Drink at least 8 tall glasses of water a day.   . Take plenty of fiber.  Fiber is the undigested part of plant food that passes into the colon, acting s "natures broom" to encourage bowel motility and movement.  Fiber can absorb and hold large amounts of water. This results in a larger, bulkier stool, which is soft and easier to pass. Work gradually over several weeks up to 6 servings a day of fiber (25g a day even more if needed) in the form of: o Vegetables -- Root (potatoes, carrots, turnips), leafy green (lettuce, salad greens, celery, spinach), or cooked high residue (cabbage, broccoli, etc) o Fruit -- Fresh (unpeeled skin & pulp), Dried (prunes, apricots, cherries, etc ),  or stewed ( applesauce)  o Whole grain breads, pasta, etc (whole wheat)  o Bran cereals  . Bulking Agents -- This type of water-retaining fiber generally is easily obtained each day by one of the following:  o Psyllium bran -- The psyllium plant is remarkable because its ground seeds can retain so much water. This product is available as Metamucil, Konsyl, Effersyllium, Per Diem Fiber, or the less expensive generic preparation in drug and health food stores. Although labeled a laxative, it really is not a laxative.  o Methylcellulose -- This is  another fiber derived from wood which also retains water. It is available as Citrucel. o Polyethylene Glycol - and "artificial" fiber commonly called Miralax or Glycolax.  It is helpful for people with gassy or bloated feelings with regular fiber o Flax Seed - a less gassy fiber than psyllium . No reading or other relaxing activity while on the toilet. If bowel movements take longer than 5 minutes, you are too constipated. . AVOID CONSTIPATION.  High fiber and water intake usually takes care of this.  Sometimes a laxative is needed to stimulate more frequent bowel movements, but  . Laxatives are not a good long-term solution as it can wear the colon out. o Osmotics (Milk of Magnesia, Fleets phosphosoda, Magnesium citrate, MiraLax, GoLytely) are safer than  o Stimulants (Senokot, Castor Oil, Dulcolax, Ex Lax)    o Do not take laxatives for more than 7days in a row. .  IF SEVERELY CONSTIPATED, try a Bowel Retraining Program: o Do not use laxatives.  o Eat a diet high in roughage, such as bran cereals and leafy vegetables.  o Drink six (6) ounces of prune or apricot juice each morning.  o Eat two (2) large servings of stewed fruit each day.  o Take one (1) heaping tablespoon of a psyllium-based bulking agent twice a day. Use sugar-free sweetener when possible to avoid excessive calories.  o Eat a normal breakfast.  o Set aside 15 minutes after breakfast to sit on the  toilet, but do not strain to have a bowel movement.  o If you do not have a bowel movement by the third day, use an enema and repeat the above steps.

## 2018-07-12 ENCOUNTER — Encounter: Payer: Self-pay | Admitting: Physician Assistant

## 2018-07-12 ENCOUNTER — Telehealth: Payer: Self-pay | Admitting: Physician Assistant

## 2018-07-12 DIAGNOSIS — R140 Abdominal distension (gaseous): Secondary | ICD-10-CM

## 2018-07-12 DIAGNOSIS — R1084 Generalized abdominal pain: Secondary | ICD-10-CM

## 2018-07-12 NOTE — Telephone Encounter (Signed)
Copied from CRM [id number]. Topic: Quick Communication - See Telephone Encounter >> Jul 12, 2018  8:13 AM Burchel, Abbi R wrote: CRM for notification. See Telephone encounter for: 07/12/18.  Pt's mom is requesting referral to GI, pt is still experiencing abd discomfort/pressure below his sternum.  Pt saw Jarold MottoSamantha Worley on 06/30/18.   Pt's Mom: [phone]

## 2018-07-12 NOTE — Telephone Encounter (Signed)
Spoke to pt, told him I put order in for GI referral that your Mom called and requested. Told him someone will be contacting you to schedule an appointment. Pt verbalized understanding.

## 2018-07-12 NOTE — Addendum Note (Signed)
Addended by: Jimmye NormanPHANOS, Eion Timbrook J on: 07/12/2018 03:19 PM   Modules accepted: Orders

## 2018-07-20 ENCOUNTER — Ambulatory Visit: Payer: BLUE CROSS/BLUE SHIELD

## 2018-07-27 ENCOUNTER — Ambulatory Visit (INDEPENDENT_AMBULATORY_CARE_PROVIDER_SITE_OTHER): Payer: BLUE CROSS/BLUE SHIELD | Admitting: Surgical

## 2018-07-27 ENCOUNTER — Encounter: Payer: Self-pay | Admitting: Surgical

## 2018-07-27 DIAGNOSIS — Z23 Encounter for immunization: Secondary | ICD-10-CM

## 2018-07-27 NOTE — Progress Notes (Signed)
Patient comes in today for his second Bexsero injection. Injection given in left deltoid. Patient tolerated well.

## 2018-07-28 ENCOUNTER — Encounter

## 2018-07-28 ENCOUNTER — Encounter: Payer: Self-pay | Admitting: Physician Assistant

## 2018-07-28 ENCOUNTER — Ambulatory Visit: Payer: BLUE CROSS/BLUE SHIELD | Admitting: Physician Assistant

## 2018-07-28 ENCOUNTER — Telehealth: Payer: Self-pay | Admitting: *Deleted

## 2018-07-28 VITALS — BP 110/60 | HR 101 | Ht 72.0 in | Wt 172.5 lb

## 2018-07-28 DIAGNOSIS — R195 Other fecal abnormalities: Secondary | ICD-10-CM | POA: Diagnosis not present

## 2018-07-28 DIAGNOSIS — R140 Abdominal distension (gaseous): Secondary | ICD-10-CM

## 2018-07-28 DIAGNOSIS — R1013 Epigastric pain: Secondary | ICD-10-CM | POA: Diagnosis not present

## 2018-07-28 NOTE — Patient Instructions (Signed)
Normal BMI (Body Mass Index- based on height and weight) is between 19 and 25. Your BMI today is Body mass index is 23.4 kg/m. Marland Kitchen. Please consider follow up  regarding your BMI with your Primary Care Provider.  We have given you samples of IBGARD. Take 2 capsules before meals for the next 2-3 weeks. We have provided you with a coupon.  Follow up with either Amy Esterwood or Dr. Lavon PaganiniNandigam as needed.

## 2018-07-28 NOTE — Telephone Encounter (Signed)
Copied from CRM [phone]. Topic: Referral - Request >> Jul 28, 2018  8:32 AM Arlyss Gandyichardson, Taren N, NT wrote: Reason for CRM: Pt requesting a referral to a urologist. He would not give details as to why.

## 2018-07-28 NOTE — Progress Notes (Signed)
Reviewed and agree with documentation and assessment and plan. K. Veena Nandigam , MD   

## 2018-07-28 NOTE — Progress Notes (Signed)
Subjective:    Patient ID: Keith Harrison, male    DOB: 2000/12/26, 17 y.o.   MRN: [id number]  HPI Keith Harrison is a pleasant 17 year old white male, new to GI today referred by Jarold Motto, PA-C/primary care for evaluation of abdominal bloating and discomfort. Patient is generally in good health, and has not had any prior GI evaluation. He was diagnosed with Mononucleosis in May 2019 and per his father was quite ill for about 3 weeks with severe fatigue, myalgias sore throat and lack of appetite.  After that it took about 3 to 4 weeks for him to get about back to his baseline.  Over the past 4 to 5 weeks he has barely been complaining of upper abdominal pressure type discomfort after eating and a feeling of fullness.  He is also developed change in bowel habits with bowel movements after most meals.  He feels that his stools look different but is not having diarrhea or melena.  He has not had any nausea or vomiting.  His dad says his appetite is back to normal at this point over the past couple of weeks and he has been eating "voraciously" He also feels that he has been complaining less about any abdominal fullness over the past couple of weeks.  He had lost weight initially with his illness and has regained a few pounds.  He denies any heartburn or indigestion no dysphagia or odynophagia. He was seen back by primary care on 07/02/2018.  KUB was done that showed some prominent stool in the rectosigmoid and he was asked to take MiraLAX which he did for about a week. H. pylori AG IgG was done and negative and lipase was within normal limits. Patient admits that he has been very anxious about his symptoms since he was diagnosed with Mono.  He has  never really been ill before and says that he was confined to the house and did not have anything else to do so he was doing a lot of reading on the Internet and got himself stressed out about all the possibilities of  illnesses.  Review of Systems Pertinent  positive and negative review of systems were noted in the above HPI section.  All other review of systems was otherwise negative.  No outpatient encounter medications on file as of 07/28/2018.   No facility-administered encounter medications on file as of 07/28/2018.    No Known Allergies Patient Active Problem List   Diagnosis Date Noted  . Anxiety about health 06/30/2018  . Factor 5 Leiden mutation, heterozygous (HCC) 02/13/2017   Social History   Socioeconomic History  . Marital status: Single    Spouse name: Not on file  . Number of children: Not on file  . Years of education: Not on file  . Highest education level: Not on file  Occupational History  . Not on file  Social Needs  . Financial resource strain: Not on file  . Food insecurity:    Worry: Not on file    Inability: Not on file  . Transportation needs:    Medical: Not on file    Non-medical: Not on file  Tobacco Use  . Smoking status: Never Smoker  . Smokeless tobacco: Never Used  Substance and Sexual Activity  . Alcohol use: No  . Drug use: No  . Sexual activity: Yes    Partners: Female  Lifestyle  . Physical activity:    Days per week: Not on file    Minutes per session: Not  on file  . Stress: Not on file  Relationships  . Social connections:    Talks on phone: Not on file    Gets together: Not on file    Attends religious service: Not on file    Active member of club or organization: Not on file    Attends meetings of clubs or organizations: Not on file    Relationship status: Not on file  . Intimate partner violence:    Fear of current or ex partner: Not on file    Emotionally abused: Not on file    Physically abused: Not on file    Forced sexual activity: Not on file  Other Topics Concern  . Not on file  Social History Narrative   [employer]es to [employer]   Has a sister    Mr. Izola PriceMazzarese's family history is not on file.      Objective:    Vitals:   07/28/18 0956  BP: 110/60  Pulse: (!) 101      Physical Exam; well-developed young white male in no acute distress, accompanied by his father both pleasant blood pressure 110/60 pulse 100, height 6 foot, weight 172, BMI 23.4.  HEENT ;nontraumatic normocephalic EOMI, PERRLA sclera anicteric buccal mucosa moist neck supple, Cardiovascular; regular rate and rhythm with S1-S2 no murmur rub gallop, Pulmonary ;clear bilaterally, Abdomen; soft, there is no focal tenderness, no palpable mass or hepatosplenomegaly bowel sounds are present.  Rectal; exam not done, Extremities; no clubbing cyanosis or edema skin warm and dry,Neuro psych ;alert and oriented, grossly nonfocal mood and affect appropriate       Assessment & Plan:   #651 17 year old white male diagnosed with mononucleosis May 2019 who is recovering. Associated with his illness he had very poor appetite and weight loss.  Now over the past 4 to 5 weeks had been experiencing some upper abdominal pressure and fullness postprandially and some change in bowel habits with bowel movements postprandially.  Appetite has returned to normal and he has gained weight. 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   #2 anxiety  Plan; I do not think he needs any further GI intervention at this time.  I think his residual symptoms will gradually improve over the next 4 to 6 weeks.  Patient was reassured. We will give him a trial of IBgard to before meals over the next couple of weeks.  He may continue if he feels this is beneficial. GI follow-up will be as needed.  Advised he and his father that if he was still having ongoing GI issues in 2 months to call for appointment and will reevaluate. He  will be established with Dr. Christell ConstantNandigam   Keith Harrison S Geron Mulford PA-C 07/28/2018   Cc: Jarold MottoWorley, Samantha, [place]

## 2018-07-28 NOTE — Telephone Encounter (Signed)
Spoke to pt, told him he needs to come in to see Lelon MastSamantha in order for referral to be done. Asked pt why he needs to see Urology? Pt said he is concerned about a bump on his scrotal area. [facility]. Pt verbalized understanding.

## 2018-07-29 ENCOUNTER — Encounter: Payer: Self-pay | Admitting: Physician Assistant

## 2018-07-29 ENCOUNTER — Ambulatory Visit: Payer: BLUE CROSS/BLUE SHIELD | Admitting: Physician Assistant

## 2018-07-29 VITALS — BP 118/78 | HR 102 | Ht 72.0 in | Wt 174.0 lb

## 2018-07-29 DIAGNOSIS — Q5529 Other congenital malformations of testis and scrotum: Secondary | ICD-10-CM

## 2018-07-29 DIAGNOSIS — R591 Generalized enlarged lymph nodes: Secondary | ICD-10-CM | POA: Diagnosis not present

## 2018-07-29 NOTE — Patient Instructions (Signed)
It was great to see you!  You will be contacted about your ultrasound.  Let's follow-up whenever needed.  Take care,  Jarold MottoSamantha Josef Tourigny PA-C

## 2018-07-30 ENCOUNTER — Encounter: Payer: Self-pay | Admitting: Physician Assistant

## 2018-07-30 NOTE — Progress Notes (Signed)
Keith Harrison is a 17 y.o. male here for a new problem.   History of Present Illness:   Chief Complaint  Patient presents with  . bump on scrotum    First noticed about 2 months ago. Feels like swelling and is most visible when skin is lax. The area is not pain, no drainge or redness.   Marland Kitchen. lump in groin    f/u on lump R groin area, it has gotten slightly smaller but hasn't gone away. It is not painful and does not drain, no erythema.     HPI   Bump on scrotum and lump in groin First noticed the bump on his scrotum about 2 months ago. Feels like swelling and is most visible when skin is lax. The area is not pain, no drainge or redness. States that he told his step dad about it and he said that he had a history of epididymitis and thought maybe it was thought. Patient has been researching about epididymitis online. He noticed a possible bump or lymphnode on the R side of his groin a few months ago, states that it has gotten smaller. Denies fevers, unintentional weight loss, blood cough, night sweats. He also reports that he has not been sexually active since his last visit -- declines any STI testing today.    Past Medical History:  Diagnosis Date  . Factor 5 Leiden mutation, heterozygous (HCC)   . Mononucleosis 04/2018     Social History   Socioeconomic History  . Marital status: Single    Spouse name: Not on file  . Number of children: Not on file  . Years of education: Not on file  . Highest education level: Not on file  Occupational History  . Not on file  Social Needs  . Financial resource strain: Not on file  . Food insecurity:    Worry: Not on file    Inability: Not on file  . Transportation needs:    Medical: Not on file    Non-medical: Not on file  Tobacco Use  . Smoking status: Never Smoker  . Smokeless tobacco: Never Used  Substance and Sexual Activity  . Alcohol use: No  . Drug use: No  . Sexual activity: Yes    Partners: Female  Lifestyle  . Physical  activity:    Days per week: Not on file    Minutes per session: Not on file  . Stress: Not on file  Relationships  . Social connections:    Talks on phone: Not on file    Gets together: Not on file    Attends religious service: Not on file    Active member of club or organization: Not on file    Attends meetings of clubs or organizations: Not on file    Relationship status: Not on file  . Intimate partner violence:    Fear of current or ex partner: Not on file    Emotionally abused: Not on file    Physically abused: Not on file    Forced sexual activity: Not on file  Other Topics Concern  . Not on file  Social History Narrative   Goes to [employer]   Has a sister    Past Surgical History:  Procedure Laterality Date  . WISDOM TOOTH EXTRACTION      Family History  Problem Relation Age of Onset  . Colon cancer Neg Hx   . Prostate cancer Neg Hx   . Esophageal cancer Neg Hx  Pt said he doesn't think so  . Rectal cancer Neg Hx     No Known Allergies  Current Medications:  No current outpatient medications on file.   Review of Systems:   ROS  Negative unless otherwise specified per HPI.  Vitals:   Vitals:   07/29/18 1107  BP: 118/78  Pulse: (!) 102  SpO2: 97%  Weight: 174 lb (78.9 kg)  Height: 6' (1.829 m)     Body mass index is 23.6 kg/m.  Physical Exam:   Physical Exam  Constitutional: He appears well-developed. He is cooperative.  Non-toxic appearance. He does not have a sickly appearance. He does not appear ill. No distress.  Cardiovascular: Regular rhythm, S1 normal, S2 normal, normal heart sounds and normal pulses. Tachycardia present.  No LE edema  Pulmonary/Chest: Effort normal and breath sounds normal.  Genitourinary:     Genitourinary Comments: With movement of L testicle, small "protrusion" evident when superior portion of L testicle is taut. No tenderness with palpation. No abnormality appreciated in L testicle. No discoloration  evident.  Neurological: He is alert. GCS eye subscore is 4. GCS verbal subscore is 5. GCS motor subscore is 6.  Skin: Skin is warm, dry and intact.  Psychiatric: He has a normal mood and affect. His speech is normal and behavior is normal.  Nursing note and vitals reviewed.   Assessment and Plan:    Wilhelm was seen today for bump on scrotum and lump in groin.  Diagnoses and all orders for this visit:  Testicular anomaly Will obtain u/s for further work-up. No treatment at this time. Patient is agreeable to plan. If symptoms change in the meantime, I recommend follow-up with [name]. Declined STI testing. -     US Scrotum; Future  Lymphadenopathy Area patient concerned about not appreciated on exam. I did offer u/s for further work-up however he declined. Follow-up if symptoms or concerns arise. Patient is agreeable to plan.  . Reviewed expectations re: course of current medical issues. . Discussed self-management of symptoms. . Outlined signs and symptoms indicating need for more acute intervention. . Patient verbalized understanding and all questions were answered. . See orders for this visit as documented in the electronic medical record. . Patient received an After-Visit Summary.  CMA or LPN served as scribe during this visit. History, Physical, and Plan performed by medical provider. Documentation and orders reviewed and attested to.   Jarold Motto, PA-C

## 2018-08-06 ENCOUNTER — Ambulatory Visit
Admission: RE | Admit: 2018-08-06 | Discharge: 2018-08-06 | Disposition: A | Discharge status: Home / Self Care | Payer: BLUE CROSS/BLUE SHIELD | Source: Ambulatory Visit | Attending: Physician Assistant | Admitting: Physician Assistant

## 2018-08-06 DIAGNOSIS — N503 Cyst of epididymis: Secondary | ICD-10-CM | POA: Diagnosis not present

## 2018-08-06 DIAGNOSIS — Q5529 Other congenital malformations of testis and scrotum: Secondary | ICD-10-CM

## 2018-09-02 ENCOUNTER — Ambulatory Visit (INDEPENDENT_AMBULATORY_CARE_PROVIDER_SITE_OTHER): Payer: BLUE CROSS/BLUE SHIELD | Admitting: Physician Assistant

## 2018-09-02 ENCOUNTER — Encounter: Payer: Self-pay | Admitting: Physician Assistant

## 2018-09-02 ENCOUNTER — Other Ambulatory Visit (INDEPENDENT_AMBULATORY_CARE_PROVIDER_SITE_OTHER): Payer: BLUE CROSS/BLUE SHIELD

## 2018-09-02 VITALS — BP 124/80 | HR 76 | Ht 72.0 in | Wt 177.4 lb

## 2018-09-02 DIAGNOSIS — K589 Irritable bowel syndrome without diarrhea: Secondary | ICD-10-CM | POA: Diagnosis not present

## 2018-09-02 DIAGNOSIS — K921 Melena: Secondary | ICD-10-CM

## 2018-09-02 LAB — IGA: IgA: 97 mg/dL (ref 68–378)

## 2018-09-02 LAB — SEDIMENTATION RATE: SED RATE: 2 mm/h (ref 0–15)

## 2018-09-02 LAB — HIGH SENSITIVITY CRP: CRP, High Sensitivity: 0.39 mg/L (ref 0.000–5.000)

## 2018-09-02 NOTE — Progress Notes (Signed)
Subjective:    Patient ID: Keith Harrison, male    DOB: August 30, 2001, 17 y.o.   MRN: [id number]  HPI Keith Harrison is a pleasant 17 year old white male, who was initially seen as a new patient on 07/28/2018 by myself and established with Dr. Lavon Paganini.  At that time patient had developed dyspepsia, postprandial fullness and some changes in bowel habits with increased frequency of stool and somewhat looser stools after being diagnosed with mononucleosis in May 2019.  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  It was also apparent that he had a lot of anxiety regarding his recent health issues.  He was given a trial of IBgard, and asked to follow-up if he continued to have symptoms over the next couple of months. 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  He comes in today stating that he had been doing well, and really felt like the IBgard helped him.  He seemed to have normalization of his bowel movements in bowel habits with the IBgard. Had a very stressful week last week says actually he had been quite stressed all summer.  He had an episode of significant anxiety that was associated with nausea and gagging last week after a family event.  And then noticed some looser stools which concerned him.  He is not having any abdominal pain.  He says he thinks he saw a couple of flecks of blood on a bowel movement a couple of weeks ago but that has not recurred.  He also says he had some dark stools several weeks ago. He is talked to his parents about their family stress issues, he plans to see a counselor at school tomorrow, and resumed what IBgard he had left and now over the past 3 days he feels pretty much back to normal. Says he wanted to come in just to be sure that there was not anything else going on.  Review of Systems Pertinent positive and negative review of systems were noted in the above HPI section.  All other review of  systems was otherwise negative.  Outpatient Encounter Medications as of 09/02/2018  Medication Sig  . Peppermint Oil (IBGARD PO) Take by mouth as needed.   No facility-administered encounter medications on file as of 09/02/2018.    No Known Allergies Patient Active Problem List   Diagnosis Date Noted  . Anxiety about health 06/30/2018  . Factor 5 Leiden mutation, heterozygous (HCC) 02/13/2017   Social History   Socioeconomic History  . Marital status: Single    Spouse name: Not on file  . Number of children: Not on file  . Years of education: Not on file  . Highest education level: Not on file  Occupational History  . Occupation: Barista- Academic librarian  Social Needs  . Financial resource strain: Not on file  . Food insecurity:    Worry: Not on file    Inability: Not on file  . Transportation needs:    Medical: Not on file    Non-medical: Not on file  Tobacco Use  . Smoking status: Never Smoker  . Smokeless tobacco: Never Used  Substance and Sexual Activity  . Alcohol use: No  . Drug use: No  . Sexual activity: Yes    Partners: Female  Lifestyle  . Physical activity:    Days per week: Not on file    Minutes per session: Not on file  . Stress: Not on  file  Relationships  . Social connections:    Talks on phone: Not on file    Gets together: Not on file    Attends religious service: Not on file    Active member of club or organization: Not on file    Attends meetings of clubs or organizations: Not on file    Relationship status: Not on file  . Intimate partner violence:    Fear of current or ex partner: Not on file    Emotionally abused: Not on file    Physically abused: Not on file    Forced sexual activity: Not on file  Other Topics Concern  . Not on file  Social History Narrative   Goes to [employer]   Has a sister    Mr. Izola PriceMazzarese's family history is not on file.      Objective:    Vitals:   09/02/18 1336  BP: 124/80  Pulse: 76    Physical  Exam; well-developed young white male in no acute distress, pleasant blood pressure 124/80 pulse 76, height 6 foot, weight 177, BMI 24.0.  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 Patient comes in today for follow-up.  He had a recent flareup of symptoms directly associated with significant stress. He had good response to IBgard.  #2 question blood in stool with one occurrence 2 weeks ago #3  Anxiety  Plan; sed rate, CRP, IFOB I have given him more samples of IBgard today, asked him to take 2 tablets twice daily and continue the IBgard if this is helpful. We discussed the relation between diet upset, IBS and anxiety/stress. Also discussed post infectious IBS, and he was advised that the symptoms can take months to completely resolve He is encouraged to make an appointment with a counselor at school for stress management We will follow-up on above labs, and and happy to see him an as-needed basis.  Keith Harrison S Bella Brummet PA-C 09/02/2018   Cc: Jarold MottoWorley, Samantha, [place]

## 2018-09-02 NOTE — Patient Instructions (Signed)
Your provider has requested that you go to the basement level for lab work before leaving today. Also a stool test will be given to you. Press "B" on the elevator. The lab is located at the first door on the left as you exit the elevator.  We have provided you with IBGARD capsules- take 2 capsules twice daily.  This is over the counter medication. We have given you samples and coupons.   Normal BMI (Body Mass Index- based on height and weight) is between 19 and 25. Your BMI today is Body mass index is 24.06 kg/m. Marland Kitchen. Please consider follow up  regarding your BMI with your Primary Care Provider.

## 2018-09-06 LAB — TISSUE TRANSGLUTAMINASE, IGG: (TTG) AB, IGG: 2 U/mL

## 2018-09-06 NOTE — Progress Notes (Signed)
Reviewed and agree with documentation and assessment and plan. K. Veena Elohim Brune , MD   

## 2018-09-13 ENCOUNTER — Other Ambulatory Visit (INDEPENDENT_AMBULATORY_CARE_PROVIDER_SITE_OTHER): Payer: BLUE CROSS/BLUE SHIELD

## 2018-09-13 DIAGNOSIS — K921 Melena: Secondary | ICD-10-CM | POA: Diagnosis not present

## 2018-09-13 LAB — FECAL OCCULT BLOOD, IMMUNOCHEMICAL: Fecal Occult Bld: NEGATIVE

## 2018-09-13 IMAGING — CT CT ANGIO CHEST
2 of 8 series · 19 of 46 positions shown · IV contrast (ISOVUE 370)
Comparison: None.

CLINICAL DATA: Short of breath.  Factor 5 Chong mutation

EXAM:
CT ANGIOGRAPHY CHEST WITH CONTRAST
TECHNIQUE: Multidetector CT imaging of the chest was performed using the
standard protocol during bolus administration of intravenous
contrast. Multiplanar CT image reconstructions and MIPs were
obtained to evaluate the vascular anatomy.
CONTRAST:  80mL RGDG30-16H IOPAMIDOL (RGDG30-16H) INJECTION 76%

[Series 5: thins · axial · 0.66mm/px · z∈[-318,-42]mm · 16 of 312 slices shown]
[im 18/312  lung]
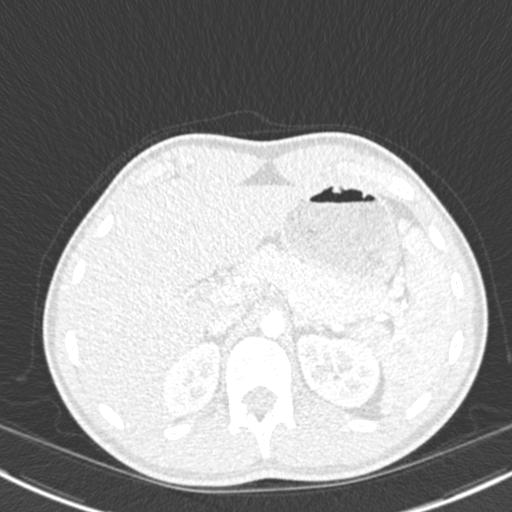
[im 35/312  soft-tissue]
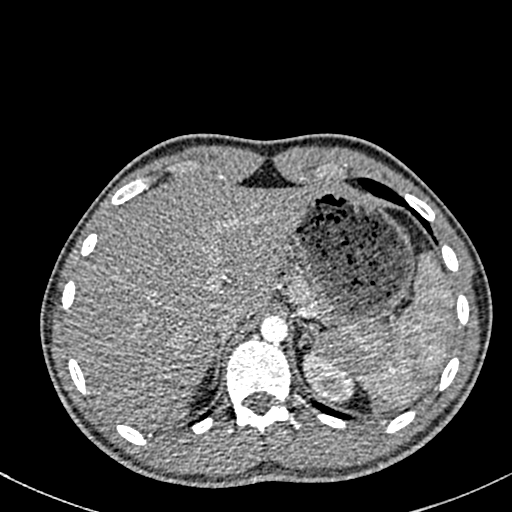
[im 52/312  lung]
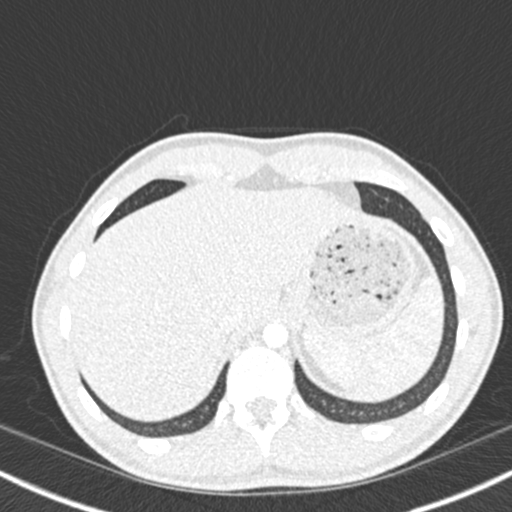
[im 70/312  soft-tissue]
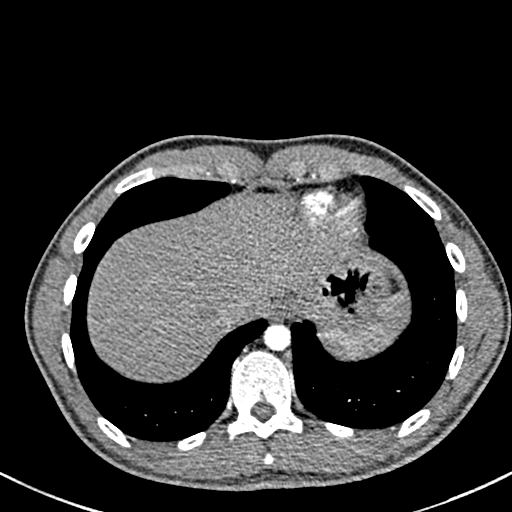
[im 87/312  lung]
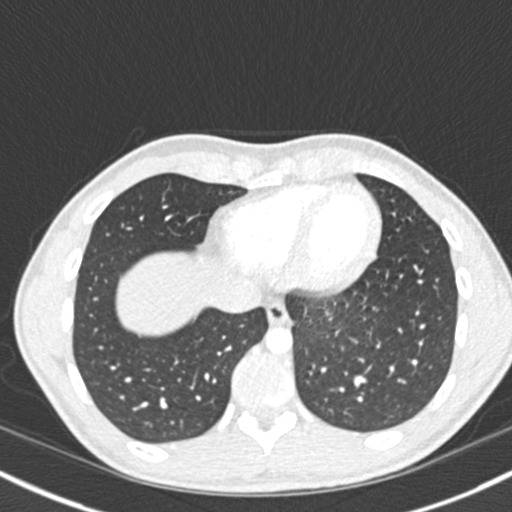
[im 104/312  soft-tissue]
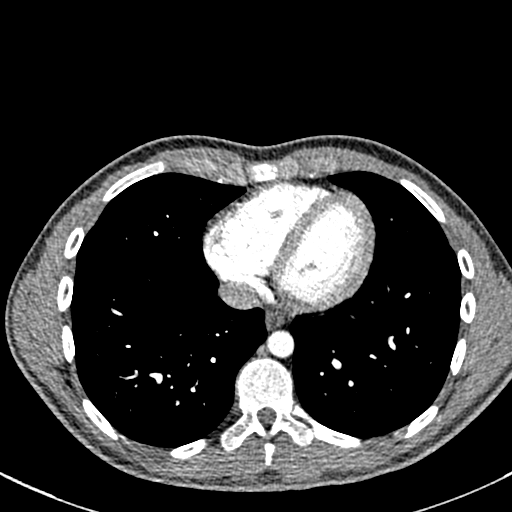
[im 121/312  lung]
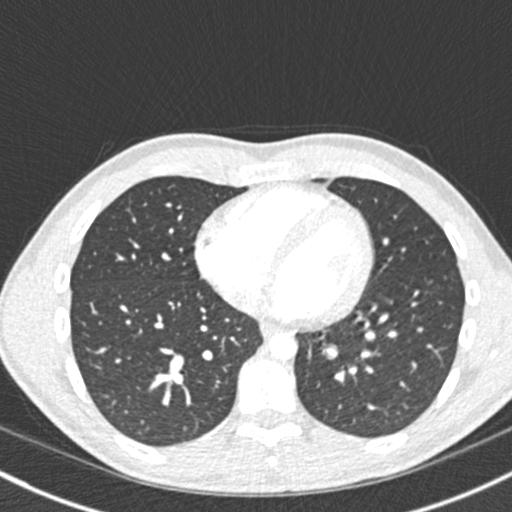
[im 139/312  soft-tissue]
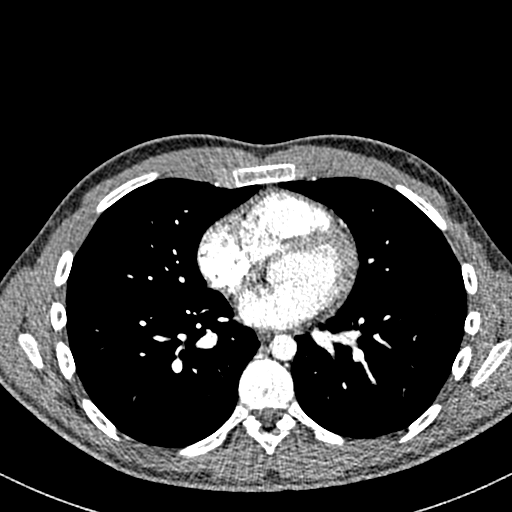
[im 173/312  lung]
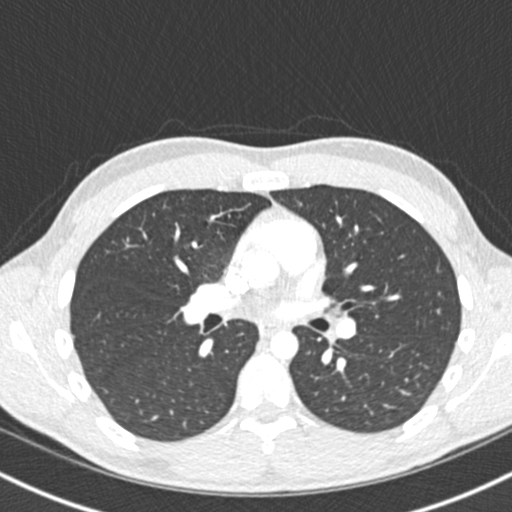
[im 191/312  soft-tissue]
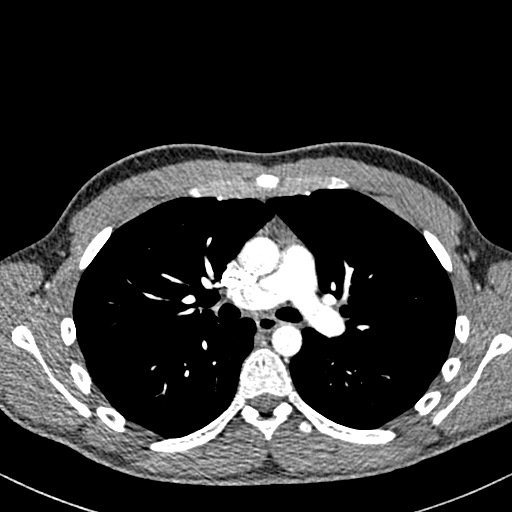
[im 208/312  lung]
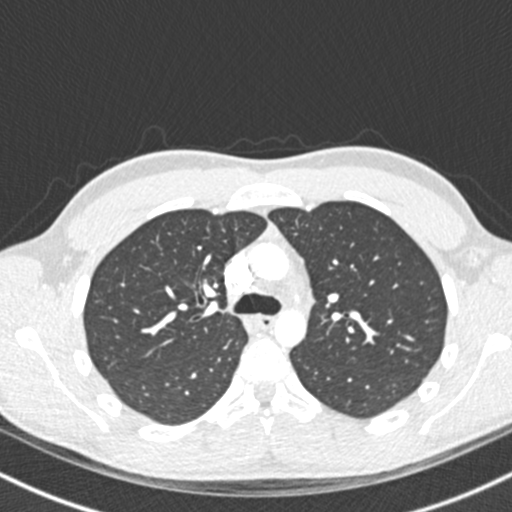
[im 225/312  soft-tissue]
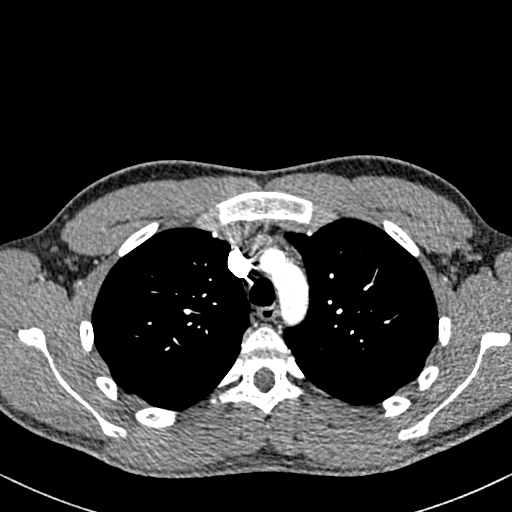
[im 242/312  lung]
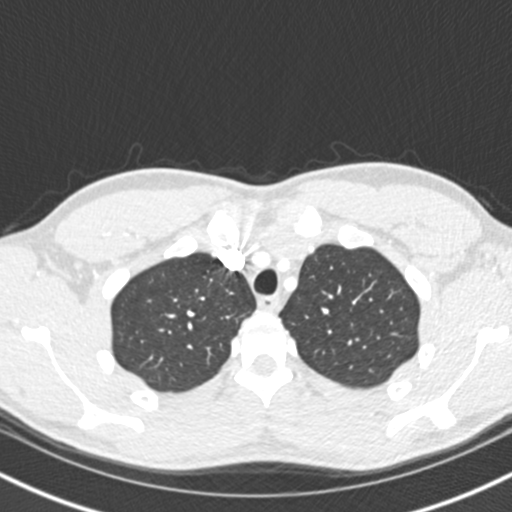
[im 260/312  soft-tissue]
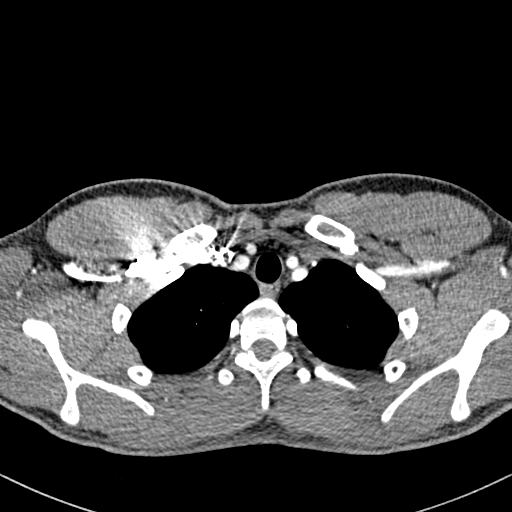
[im 277/312  lung]
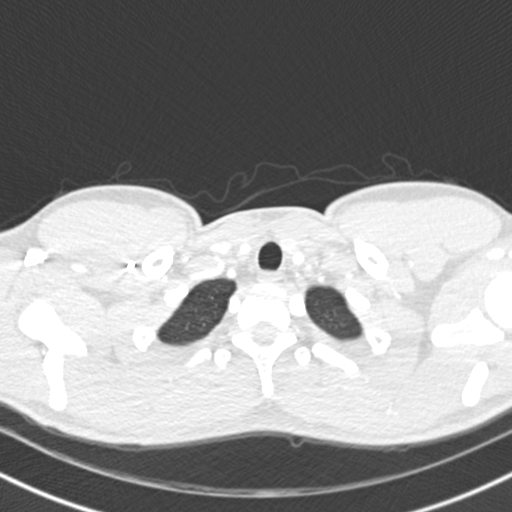
[im 294/312  soft-tissue]
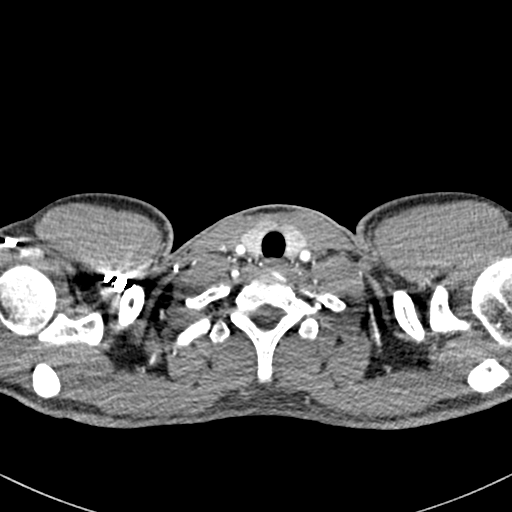

[Series 7: coronal mpr · coronal · 0.61mm/px · 3 of 111 slices shown]
[im 28/111  soft-tissue]
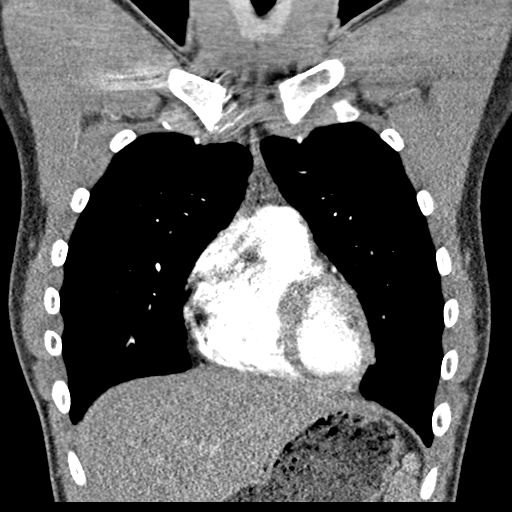
[im 56/111  soft-tissue]
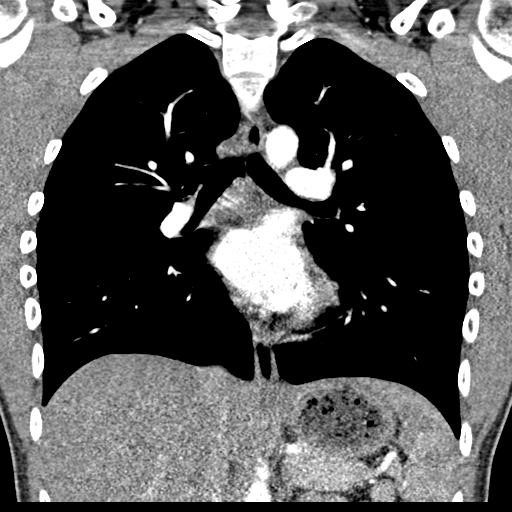
[im 83/111  soft-tissue]
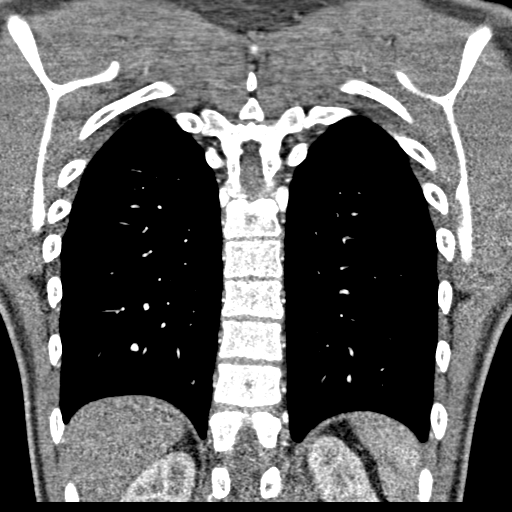

[19 of 46 positions shown; findings below may reference images not displayed]

FINDINGS: Cardiovascular: Negative for pulmonary embolism. Normal pulmonary
arteries. Thoracic aorta normal. Heart size normal.

Mediastinum/Nodes: Negative for mass or adenopathy

Lungs/Pleura: Lungs are clear.  No infiltrate effusion or mass.

Upper Abdomen: Negative

Musculoskeletal: Negative

Review of the MIP images confirms the above findings.
IMPRESSION: Normal CTA chest.  Negative for pulmonary embolism.

## 2018-10-04 DIAGNOSIS — L720 Epidermal cyst: Secondary | ICD-10-CM | POA: Diagnosis not present

## 2018-10-04 DIAGNOSIS — B078 Other viral warts: Secondary | ICD-10-CM | POA: Diagnosis not present

## 2018-10-04 DIAGNOSIS — A630 Anogenital (venereal) warts: Secondary | ICD-10-CM | POA: Diagnosis not present

## 2018-11-17 DIAGNOSIS — B078 Other viral warts: Secondary | ICD-10-CM | POA: Diagnosis not present

## 2018-11-17 DIAGNOSIS — L700 Acne vulgaris: Secondary | ICD-10-CM | POA: Diagnosis not present

## 2018-11-17 DIAGNOSIS — A630 Anogenital (venereal) warts: Secondary | ICD-10-CM | POA: Diagnosis not present

## 2018-12-20 ENCOUNTER — Encounter: Payer: Self-pay | Admitting: Physician Assistant

## 2018-12-20 ENCOUNTER — Ambulatory Visit: Payer: BLUE CROSS/BLUE SHIELD | Admitting: Physician Assistant

## 2018-12-20 ENCOUNTER — Other Ambulatory Visit (HOSPITAL_COMMUNITY)
Admission: RE | Admit: 2018-12-20 | Discharge: 2018-12-20 | Disposition: A | Discharge status: Home / Self Care | Payer: BLUE CROSS/BLUE SHIELD | Source: Ambulatory Visit | Attending: Physician Assistant | Admitting: Physician Assistant

## 2018-12-20 VITALS — BP 130/76 | HR 78 | Temp 97.7°F | Ht 72.0 in | Wt 189.2 lb

## 2018-12-20 DIAGNOSIS — Z202 Contact with and (suspected) exposure to infections with a predominantly sexual mode of transmission: Secondary | ICD-10-CM | POA: Diagnosis not present

## 2018-12-20 DIAGNOSIS — L989 Disorder of the skin and subcutaneous tissue, unspecified: Secondary | ICD-10-CM | POA: Diagnosis not present

## 2018-12-20 NOTE — Progress Notes (Signed)
Keith Harrison is a 17 y.o. male here for a new problem.  I acted as a Neurosurgeonscribe for Energy East CorporationSamantha Yanette Tripoli, PA-C Corky Mullonna Orphanos, LPN  History of Present Illness:   Chief Complaint  Patient presents with  . STI Testing    HPI   STI Testing Pt here today for STI Testing and also c/o 2 lesions in pubic area. He has been to dermatology and they told him that he might have HPV. They froze the lesions and they are gone, however two appeared.  He is sexually active with three partners. Always wears condoms but did recently have one break. States that his partners do not have symptoms.  Denies: penile discharge, swollen lymph nodes, fever, unusual weight changes   Past Medical History:  Diagnosis Date  . Factor 5 Leiden mutation, heterozygous (HCC)   . Mononucleosis 04/2018     Social History   Socioeconomic History  . Marital status: Single    Spouse name: Not on file  . Number of children: Not on file  . Years of education: Not on file  . Highest education level: Not on file  Occupational History  . 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 Financial resource strain: Not on file  . Food insecurity:    Worry: Not on file    Inability: Not on file  . Transportation needs:    Medical: Not on file    Non-medical: Not on file  Tobacco Use  . Smoking status: Never Smoker  . Smokeless tobacco: Never Used  Substance and Sexual Activity  . Alcohol use: No  . Drug use: No  . Sexual activity: Yes    Partners: Female  Lifestyle  . Physical activity:    Days per week: Not on file    Minutes per session: Not on file  . Stress: Not on file  Relationships  . Social connections:    Talks on phone: Not on file    Gets together: Not on file    Attends religious service: Not on file    Active member of club or organization: Not on file    Attends meetings of clubs or organizations: Not on file    Relationship status: Not on file  . Intimate partner violence:    Fear of  current or ex partner: Not on file    Emotionally abused: Not on file    Physically abused: Not on file    Forced sexual activity: Not on file  Other Topics Concern  . Not on file  Social History Narrative   Goes to [employer]   Has a sister    Past Surgical History:  Procedure Laterality Date  . WISDOM TOOTH EXTRACTION      Family History  Problem Relation Age of Onset  . Colon cancer Neg Hx   . Prostate cancer Neg Hx   . Esophageal cancer Neg Hx        Pt said he doesn't think so  . Rectal cancer Neg Hx     No Known Allergies  Current Medications:   Current Outpatient Medications:  .  minocycline (MINOCIN,DYNACIN) 50 MG capsule, Take 50 mg by mouth 2 (two) times daily., Disp: , Rfl:  .  Peppermint Oil (IBGARD PO), Take by mouth as needed., Disp: , Rfl:    Review of Systems:   ROS  Negative unless otherwise specified per HPI.  Vitals:   Vitals:   12/20/18 1324  BP: 130/76  Pulse: 78  Temp: 97.7 F (36.5 C)  TempSrc: Oral  SpO2: 98%  Weight: 189 lb 4 oz (85.8 kg)  Height: 6' (1.829 m)     Body mass index is 25.67 kg/m.  Physical Exam:   Physical Exam Vitals signs and nursing note reviewed.  Constitutional:      General: He is not in acute distress.    Appearance: He is well-developed. He is not ill-appearing or toxic-appearing.  Cardiovascular:     Rate and Rhythm: Normal rate and regular rhythm.     Pulses: Normal pulses.     Heart sounds: Normal heart sounds, S1 normal and S2 normal.     Comments: No LE edema Pulmonary:     Effort: Pulmonary effort is normal.     Breath sounds: Normal breath sounds.  Skin:    General: Skin is warm and dry.     Comments: Two 1/2 mm flat papule, flesh-colored -- without evidence of discharge or erythema, to pubic area  Neurological:     Mental Status: He is alert.     GCS: GCS eye subscore is 4. GCS verbal subscore is 5. GCS motor subscore is 6.  Psychiatric:        Speech: Speech normal.        Behavior:  Behavior normal. Behavior is cooperative.     Consent: Risks and benefits of therapy discussed with patient who voices understanding and agrees with planned care. No barriers to communication or understanding identified. After obtaining informed consent, the patient's identity, procedure, and site were verified during a pause prior to proceeding with the minor surgical procedure as per universal protocol recommendations. After appropriate cleansing, liquid nitrogen was applied to two warts on pubic area.     Assessment and Plan:   Gerilyn PilgrimJacob was seen today for sti testing.  Diagnoses and all orders for this visit:  Possible exposure to STD -     Urine cytology ancillary only -     HIV antibody (with reflex) -     RPR  Skin lesion   STD screening performed. Will treat if necessary. Encouraged condom use.  Skin lesion cryotherapy performed. Aftercare, including blister formation, risks of bleeding, and risks of recurrence were discussed. All questions answered. Return for retreatment as needed.  . Reviewed expectations re: course of current medical issues. . Discussed self-management of symptoms. . Outlined signs and symptoms indicating need for more acute intervention. . Patient verbalized understanding and all questions were answered. . See orders for this visit as documented in the electronic medical record. . Patient received an After-Visit Summary.  CMA or LPN served as scribe during this visit. History, Physical, and Plan performed by medical provider. The above documentation has been reviewed and is accurate and complete.  Jarold MottoSamantha Karina Lenderman, PA-C

## 2018-12-20 NOTE — Patient Instructions (Signed)
It was great to see you!  We will call you with your lab and urine results.  Always use condoms to prevent STDs.  Take care,  Jarold MottoSamantha Bell Cai PA-C

## 2018-12-21 LAB — HIV ANTIBODY (ROUTINE TESTING W REFLEX): HIV: NONREACTIVE

## 2018-12-21 LAB — RPR: RPR: NONREACTIVE

## 2018-12-24 LAB — URINE CYTOLOGY ANCILLARY ONLY
CHLAMYDIA, DNA PROBE: NEGATIVE
NEISSERIA GONORRHEA: NEGATIVE
Trichomonas: NEGATIVE

## 2019-01-10 ENCOUNTER — Ambulatory Visit: Payer: BLUE CROSS/BLUE SHIELD | Admitting: Physician Assistant

## 2019-01-19 ENCOUNTER — Ambulatory Visit: Payer: BLUE CROSS/BLUE SHIELD | Admitting: Physician Assistant

## 2019-01-19 ENCOUNTER — Encounter: Payer: Self-pay | Admitting: Physician Assistant

## 2019-01-19 ENCOUNTER — Ambulatory Visit
Admission: RE | Admit: 2019-01-19 | Discharge: 2019-01-19 | Disposition: A | Discharge status: Home / Self Care | Payer: BLUE CROSS/BLUE SHIELD | Source: Ambulatory Visit | Attending: Physician Assistant | Admitting: Physician Assistant

## 2019-01-19 VITALS — BP 124/78 | HR 83 | Temp 99.2°F | Ht 72.0 in | Wt 194.0 lb

## 2019-01-19 DIAGNOSIS — R591 Generalized enlarged lymph nodes: Secondary | ICD-10-CM

## 2019-01-19 NOTE — Progress Notes (Signed)
Keith Harrison is a 18 y.o. male here for an acute visit.  History of Present Illness:   I, Barnie MortJoEllen Thompson, acting as a Neurosurgeonscribe for Energy East CorporationSamantha Wisdom Seybold, PA.,have documented all relevant documentation on the behalf of Keith MottoSamantha Sarah Baez, PA,as directed by  Keith MottoSamantha Smith Potenza, PA while in the presence of Keith Harrison, [place].   HPI: Patient has know on right side of groin for about a year. Was evaluated about a year ago but thought it was due to mono. It has gotten harder. He noticed the change sometime in November. No pain in area but he can feel pressure into leg when in some positions like siting or bending. No weakness in leg does have some numbness in thing and buttock area on that side. Patient denies any problems with urination, no discharge from penis. He denies: fevers, chills, unusual weight changes, any other lymphadenopathy.  PMHx, SurgHx, SocialHx, Medications, and Allergies were reviewed in the Visit Navigator and updated as appropriate.  Current Medications   Current Outpatient Medications:  .  minocycline (MINOCIN,DYNACIN) 50 MG capsule, Take 50 mg by mouth 2 (two) times daily., Disp: , Rfl:    No Known Allergies Review of Systems   Pertinent items are noted in the HPI. Otherwise, ROS is negative.  Vitals   Vitals:   01/19/19 1314  BP: 124/78  Pulse: 83  Temp: 99.2 F (37.3 C)  TempSrc: Oral  SpO2: 98%  Weight: 194 lb (88 kg)  Height: 6' (1.829 m)     Body mass index is 26.31 kg/m.  Physical Exam   Physical Exam Vitals signs and nursing note reviewed.  Constitutional:      General: He is not in acute distress.    Appearance: He is well-developed. He is not ill-appearing or toxic-appearing.  Cardiovascular:     Rate and Rhythm: Normal rate and regular rhythm.     Pulses: Normal pulses.     Heart sounds: Normal heart sounds, S1 normal and S2 normal.     Comments: No LE edema Pulmonary:     Effort: Pulmonary effort is normal.     Breath sounds: Normal breath  sounds.  Skin:    General: Skin is warm and dry.     Comments: Very small palpable nodule in upper R groin area; no skin changes or evidence of erythema/drainage  Neurological:     Mental Status: He is alert.     GCS: GCS eye subscore is 4. GCS verbal subscore is 5. GCS motor subscore is 6.  Psychiatric:        Speech: Speech normal.        Behavior: Behavior normal. Behavior is cooperative.      Assessment and Plan   Keith Harrison was seen today for groin swelling.  Diagnoses and all orders for this visit:  Lymphadenopathy -     US Pelvis Complete; Future   No red flags on exam. Will obtain u/s for further work-up.  . Reviewed expectations re: course of current medical issues. . Discussed self-management of symptoms. . Outlined signs and symptoms indicating need for more acute intervention. . Patient verbalized understanding and all questions were answered. Marland Kitchen. Health Maintenance issues including appropriate healthy diet, exercise, and smoking avoidance were discussed with patient. . See orders for this visit as documented in the electronic medical record. . Patient received an After Visit Summary.  CMA served as Neurosurgeonscribe during this visit. History, Physical, and Plan performed by medical provider. The above documentation has been reviewed and is  accurate and complete.   Keith MottoSamantha Deshanna Kama, PA [place], [place] 01/19/2019

## 2019-01-20 ENCOUNTER — Telehealth: Payer: Self-pay | Admitting: Physician Assistant

## 2019-01-20 ENCOUNTER — Other Ambulatory Visit: Payer: Self-pay

## 2019-01-20 DIAGNOSIS — F411 Generalized anxiety disorder: Secondary | ICD-10-CM | POA: Diagnosis not present

## 2019-01-20 DIAGNOSIS — R591 Generalized enlarged lymph nodes: Secondary | ICD-10-CM

## 2019-01-20 NOTE — Telephone Encounter (Signed)
See note  Copied from CRM [id number]. Topic: General - Other >> Jan 20, 2019 11:50 AM Gaynelle Adu wrote: Reason for CRM:  Patient  father is requesting to have the patients have labs drawn before he leaves for school tomorrow. The best contact number [phone].

## 2019-01-20 NOTE — Telephone Encounter (Signed)
Left message on pt's voicemail to call office.  Spoke to pt's mother Kyla Balzarine told her I tried [facility] but did not answer. Told her I was calling about message from father requesting labs be done before going back to school. Told her there are no lab orders at this time. Consult for referral to General Surgeon order was placed and they will contact him about an appointment. Kyla Balzarine verbalized understanding and said father is concerned and she is if this is an urgent matter. Told her U/S showed normal appearing lymph nodes. Told her Lelon Mast consulted with another physician and because Clemens has been c/o about the lymph node and there is a hard knot thought it was best to be evaluated. Tatiana verbalized understanding and thanked me for explaining things. Told her your welcome.  Pt called back told him I spoke to his mother and explained everything to him as stated above.  Pt verbalized understanding.

## 2019-02-04 DIAGNOSIS — R1031 Right lower quadrant pain: Secondary | ICD-10-CM | POA: Diagnosis not present

## 2019-02-04 DIAGNOSIS — G8929 Other chronic pain: Secondary | ICD-10-CM | POA: Diagnosis not present

## 2019-02-17 DIAGNOSIS — F411 Generalized anxiety disorder: Secondary | ICD-10-CM | POA: Diagnosis not present

## 2019-03-12 ENCOUNTER — Emergency Department (HOSPITAL_BASED_OUTPATIENT_CLINIC_OR_DEPARTMENT_OTHER): Payer: BLUE CROSS/BLUE SHIELD

## 2019-03-12 ENCOUNTER — Encounter (HOSPITAL_BASED_OUTPATIENT_CLINIC_OR_DEPARTMENT_OTHER): Payer: Self-pay | Admitting: Emergency Medicine

## 2019-03-12 ENCOUNTER — Emergency Department (HOSPITAL_BASED_OUTPATIENT_CLINIC_OR_DEPARTMENT_OTHER)
Admission: EM | Admit: 2019-03-12 | Discharge: 2019-03-12 | Disposition: A | Discharge status: Home / Self Care | Payer: BLUE CROSS/BLUE SHIELD | Attending: Emergency Medicine | Admitting: Emergency Medicine

## 2019-03-12 ENCOUNTER — Other Ambulatory Visit: Payer: Self-pay

## 2019-03-12 DIAGNOSIS — Y93G1 Activity, food preparation and clean up: Secondary | ICD-10-CM | POA: Diagnosis not present

## 2019-03-12 DIAGNOSIS — Y999 Unspecified external cause status: Secondary | ICD-10-CM | POA: Diagnosis not present

## 2019-03-12 DIAGNOSIS — S61210A Laceration without foreign body of right index finger without damage to nail, initial encounter: Secondary | ICD-10-CM | POA: Diagnosis not present

## 2019-03-12 DIAGNOSIS — Y929 Unspecified place or not applicable: Secondary | ICD-10-CM | POA: Insufficient documentation

## 2019-03-12 DIAGNOSIS — Z79899 Other long term (current) drug therapy: Secondary | ICD-10-CM | POA: Diagnosis not present

## 2019-03-12 DIAGNOSIS — S61219A Laceration without foreign body of unspecified finger without damage to nail, initial encounter: Secondary | ICD-10-CM

## 2019-03-12 DIAGNOSIS — W268XXA Contact with other sharp object(s), not elsewhere classified, initial encounter: Secondary | ICD-10-CM | POA: Insufficient documentation

## 2019-03-12 DIAGNOSIS — S6991XA Unspecified injury of right wrist, hand and finger(s), initial encounter: Secondary | ICD-10-CM | POA: Diagnosis not present

## 2019-03-12 DIAGNOSIS — Z23 Encounter for immunization: Secondary | ICD-10-CM | POA: Insufficient documentation

## 2019-03-12 DIAGNOSIS — S61212A Laceration without foreign body of right middle finger without damage to nail, initial encounter: Secondary | ICD-10-CM | POA: Insufficient documentation

## 2019-03-12 MED ORDER — LIDOCAINE HCL (PF) 1 % IJ SOLN
10.0000 mL | Freq: Once | INTRAMUSCULAR | Status: DC
  Filled 2019-03-12: qty 10

## 2019-03-12 MED ORDER — TETANUS-DIPHTH-ACELL PERTUSSIS 5-2.5-18.5 LF-MCG/0.5 IM SUSP
0.5000 mL | Freq: Once | INTRAMUSCULAR | Status: AC
  Administered 2019-03-12: 0.5 mL via INTRAMUSCULAR
  Filled 2019-03-12: qty 0.5

## 2019-03-12 NOTE — Discharge Instructions (Signed)
Remove the dressing tomorrow morning.  He can gently wrap the fingers to protect them while you are out.  Make sure that they are not too tight.  Keep the wound clean and dry for the first 24 hours. After that you may gently clean the wound with soap and water. Make sure to pat dry the wound before covering it with any dressing. You can use topical antibiotic ointment and bandage. Ice and elevate for pain relief.   You can take Tylenol or Ibuprofen as directed for pain. You can alternate Tylenol and Ibuprofen every 4 hours for additional pain relief.   Monitor closely for any signs of infection. Return to the Emergency Department for any worsening redness/swelling of the area that begins to spread, drainage from the site, worsening pain, fever or any other worsening or concerning symptoms.

## 2019-03-12 NOTE — ED Triage Notes (Signed)
laceration to R index and middle finger today while washing dishes. Last TDAP unknown.  Wound cleaned with saline and guaze in triage, wet to dry dressing applied.

## 2019-03-12 NOTE — ED Provider Notes (Signed)
[facility] EMERGENCY DEPARTMENT Provider Note   CSN: [id number] Arrival date & time: 03/12/19  1956    History   Chief Complaint Chief Complaint  Patient presents with  . Extremity Laceration    HPI Keith Harrison is a 18 y.o. male presents for evaluation of laceration to right middle and index finger that occurred just prior to ED arrival.  Patient reports that he was washing dishes when 1 of the dishes broke, causing laceration to the distal aspects of his right index and middle finger.  Patient states he does not know when his last tetanus shot was.  Patient denies any numbness/weakness.      The history is provided by the patient.    Past Medical History:  Diagnosis Date  . Factor 5 Leiden mutation, heterozygous (HCC)   . Mononucleosis 04/2018    Patient Active Problem List   Diagnosis Date Noted  . Anxiety about health 06/30/2018  . Factor 5 Leiden mutation, heterozygous (HCC) 02/13/2017    Past Surgical History:  Procedure Laterality Date  . WISDOM TOOTH EXTRACTION          Home Medications    Prior to Admission medications   Medication Sig Start Date End Date Taking? Authorizing Provider  minocycline (MINOCIN,DYNACIN) 50 MG capsule Take 50 mg by mouth 2 (two) times daily.    [provider]    Family History Family History  Problem Relation Age of Onset  . Colon cancer Neg Hx   . Prostate cancer Neg Hx   . Esophageal cancer Neg Hx        Pt said he doesn't think so  . Rectal cancer Neg Hx     Social History Social History   Tobacco Use  . Smoking status: Never Smoker  . Smokeless tobacco: Never Used  Substance Use Topics  . Alcohol use: No  . Drug use: No     Allergies   Patient has no known allergies.   Review of Systems Review of Systems  Skin: Positive for wound.  Neurological: Negative for weakness and numbness.  All other systems reviewed and are negative.    Physical Exam Updated Vital Signs BP  138/83 (BP Location: Left Arm)   Pulse 73   Temp 97.9 F (36.6 C) (Oral)   Resp 16   Ht 6' (1.829 m)   Wt 88.5 kg   SpO2 100%   BMI 26.45 kg/m   Physical Exam Vitals signs and nursing note reviewed.  Constitutional:      Appearance: He is well-developed.  HENT:     Head: Normocephalic and atraumatic.  Eyes:     General: No scleral icterus.       Right eye: No discharge.        Left eye: No discharge.     Conjunctiva/sclera: Conjunctivae normal.  Cardiovascular:     Pulses:          Radial pulses are 2+ on the right side and 2+ on the left side.  Pulmonary:     Effort: Pulmonary effort is normal.  Musculoskeletal:     Comments: Full range of motion of all 5 digits of right hand without any difficulty.  He can fully flex and extend all 5 digits.  Right middle finger with good DIP flexion/extension.  Right index finger with good flexion/extension of the DIP joint without any difficulty.  Skin:    General: Skin is warm and dry.     Capillary Refill: Capillary refill takes  less than 2 seconds.     Comments: Good distal cap refill. RUE is not dusky in appearance or cool to touch.  Right third digit has a distal skin avulsion to the tip.  It does not go into the nailbed at all.  There is no tip left attached to the end of the finger.  Right index finger has a partial distal tip avulsion of the skin with a flap still in place and secured to the distal tip.  Neurological:     Mental Status: He is alert.  Psychiatric:        Speech: Speech normal.        Behavior: Behavior normal.      ED Treatments / Results  Labs (all labs ordered are listed, but only abnormal results are displayed) Labs Reviewed - No data to display  EKG None  Radiology No results found.  Procedures Procedures (including critical care time)  Medications Ordered in ED Medications  Tdap (BOOSTRIX) injection 0.5 mL (0.5 mLs Intramuscular Given 03/12/19 2037)     Initial Impression / Assessment and  Plan / ED Course  I have reviewed the triage vital signs and the nursing notes.  Pertinent labs & imaging results that were available during my care of the patient were reviewed by me and considered in my medical decision making (see chart for details).        18 y.o. M who presents for finger laceration that occurred finger laceration that occurred just prior to ED arrival.  Patient reports he was washing dishes when the dish broke, causing lacerations of his right middle and right index finger.  On the right middle finger, he has a distal skin avulsion of the tip.  On the index finger, he has a distal skin avulsion with a flap still attached at the base.  Patient is afebrile, non-toxic appearing, sitting comfortably on examination table. Vital signs reviewed and stable. Patient is neurovascularly intact.  I discussed with patient that the middle finger does not have any evidence of deep laceration that requires stitching.  There is no skin flap that could be reattached to the area.  Will provide wound care and bandage.  Additionally, given the skin avulsion of the index finger, I discussed with patient that the flap will most likely fall off given lack of revascularization.  I offered to put a stitch in it to help reattach it or clean and bandage it and let it try and heal on its own.  Patient opted for letting it heal on its own. Tdap updated. Patient had ample opportunity for questions and discussion. All patient's questions were answered with full understanding. Strict return precautions discussed. Patient expresses understanding and agreement to plan.   Portions of this note were generated with Scientist, clinical (histocompatibility and immunogenetics). Dictation errors may occur despite best attempts at proofreading.   Final Clinical Impressions(s) / ED Diagnoses   Final diagnoses:  Laceration of finger of right hand without foreign body without damage to nail, unspecified finger, initial encounter    ED Discharge Orders     None       Rosana Hoes 03/14/19 0152    Tilden Fossa, MD 03/14/19 1233

## 2019-03-29 DIAGNOSIS — F411 Generalized anxiety disorder: Secondary | ICD-10-CM | POA: Diagnosis not present

## 2019-05-26 DIAGNOSIS — F411 Generalized anxiety disorder: Secondary | ICD-10-CM | POA: Diagnosis not present

## 2019-08-24 DIAGNOSIS — F411 Generalized anxiety disorder: Secondary | ICD-10-CM | POA: Diagnosis not present

## 2019-09-20 DIAGNOSIS — Z20828 Contact with and (suspected) exposure to other viral communicable diseases: Secondary | ICD-10-CM | POA: Diagnosis not present

## 2019-09-24 DIAGNOSIS — Z20828 Contact with and (suspected) exposure to other viral communicable diseases: Secondary | ICD-10-CM | POA: Diagnosis not present

## 2019-12-27 DIAGNOSIS — F411 Generalized anxiety disorder: Secondary | ICD-10-CM | POA: Diagnosis not present

## 2020-04-24 DIAGNOSIS — F411 Generalized anxiety disorder: Secondary | ICD-10-CM | POA: Diagnosis not present

## 2020-08-21 DIAGNOSIS — F411 Generalized anxiety disorder: Secondary | ICD-10-CM | POA: Diagnosis not present

## 2020-08-26 IMAGING — US US PELVIS COMPLETE
1 series · 7 of 7 positions shown · non-contrast
Comparison: None.

CLINICAL DATA: Palpable abnormality, right inguinal area

EXAM:
LIMITED ULTRASOUND OF PELVIS
TECHNIQUE: Limited transabdominal ultrasound examination of the right inguinal
area at the site of palpable abnormality was performed.

[Series 1: us pelvis complete · 0.04mm/px · 7 of 7 slices shown]
[im 1/7]
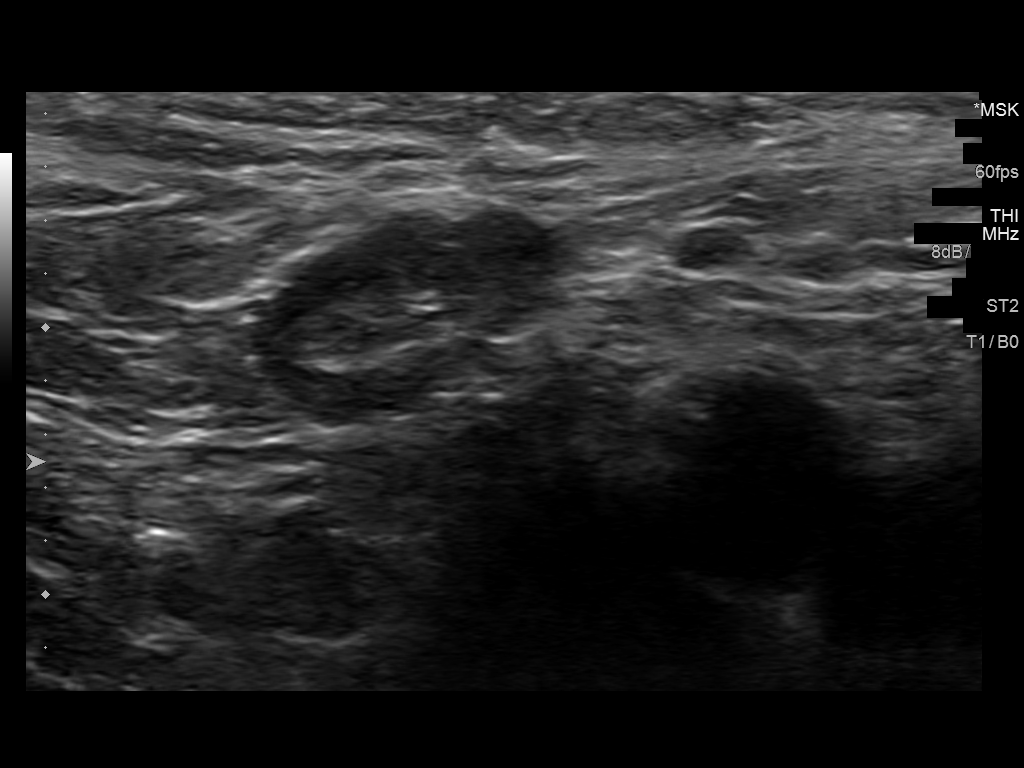
[im 2/7]
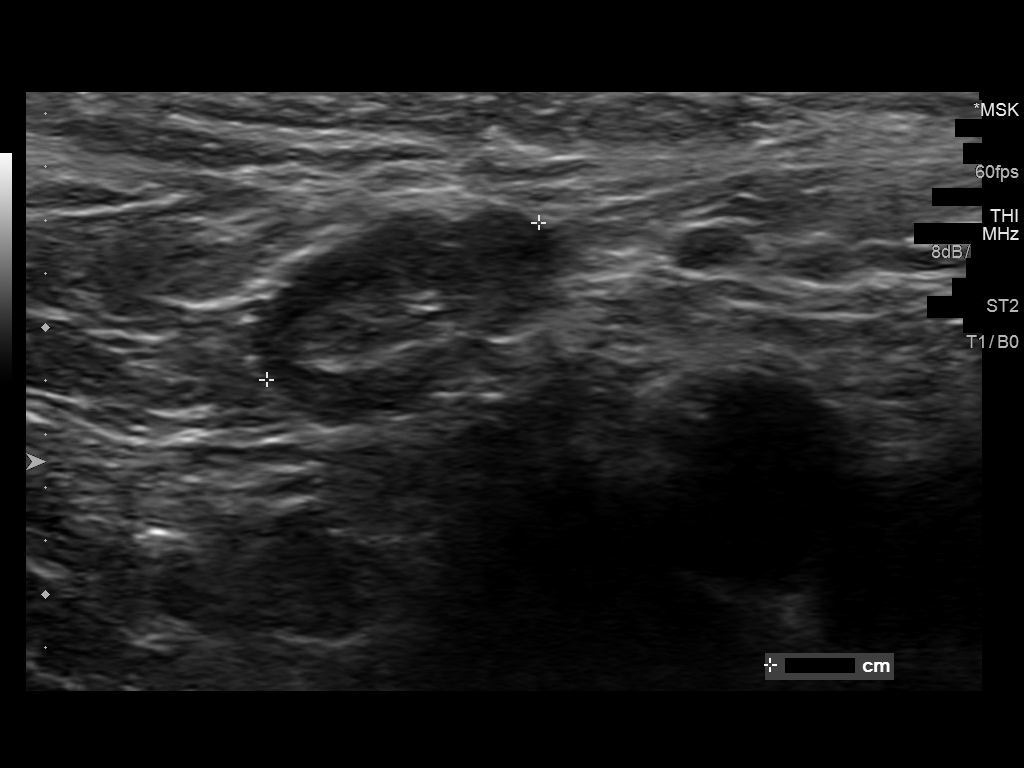
[im 3/7]
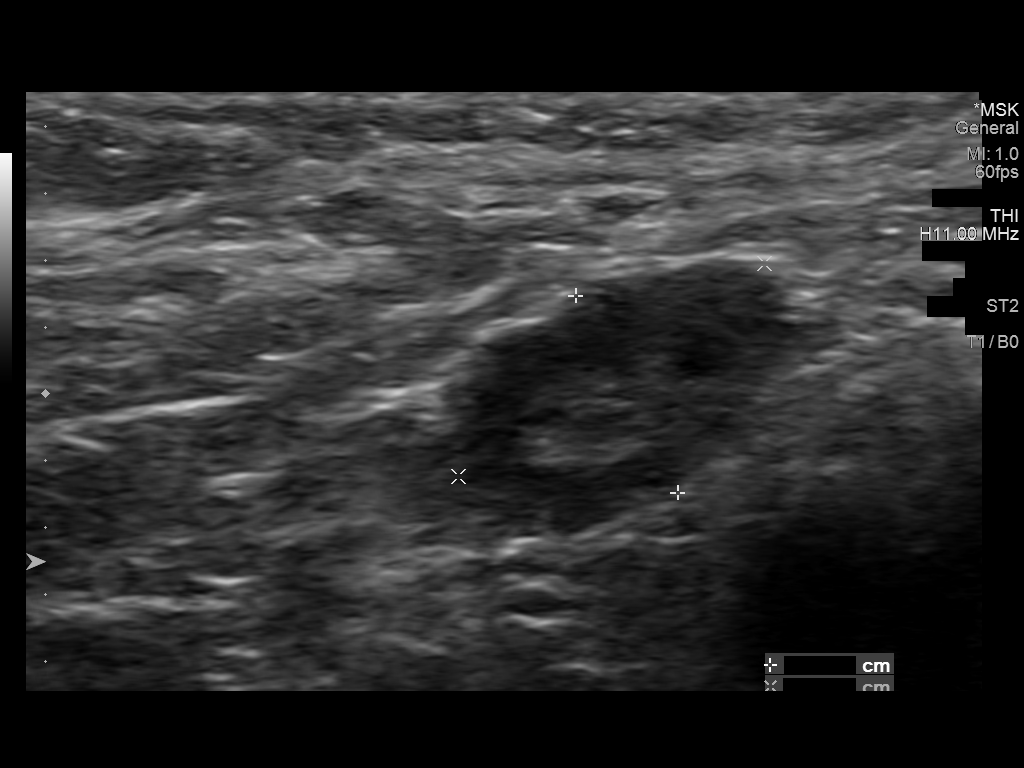
[im 4/7]
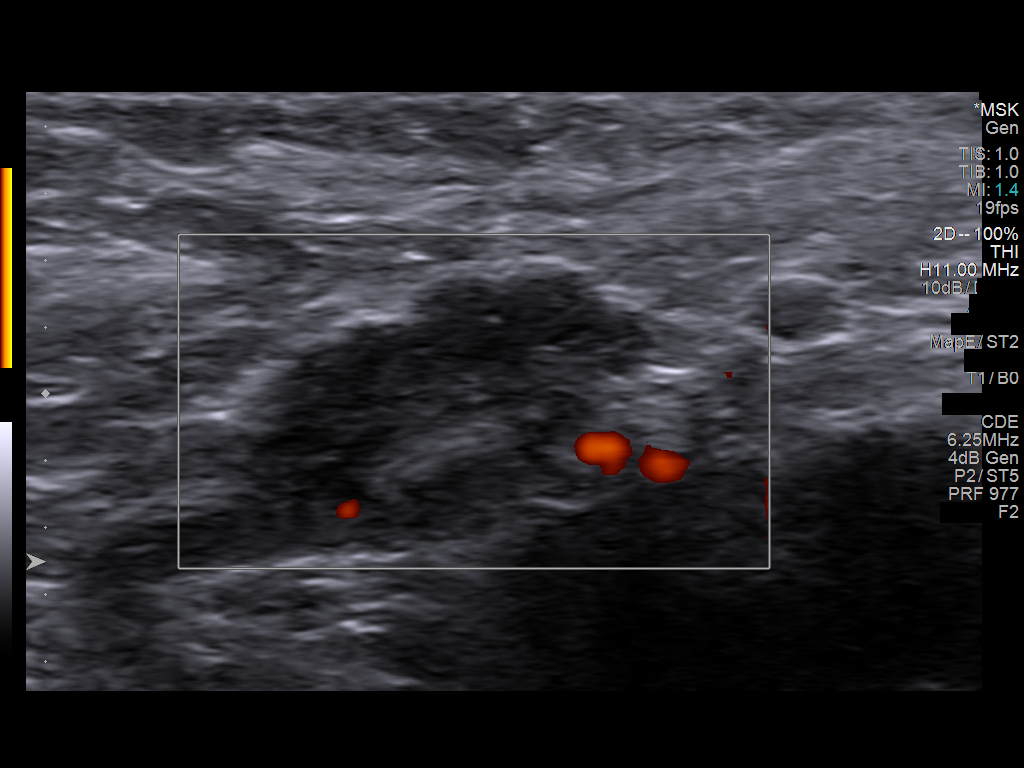
[im 5/7]
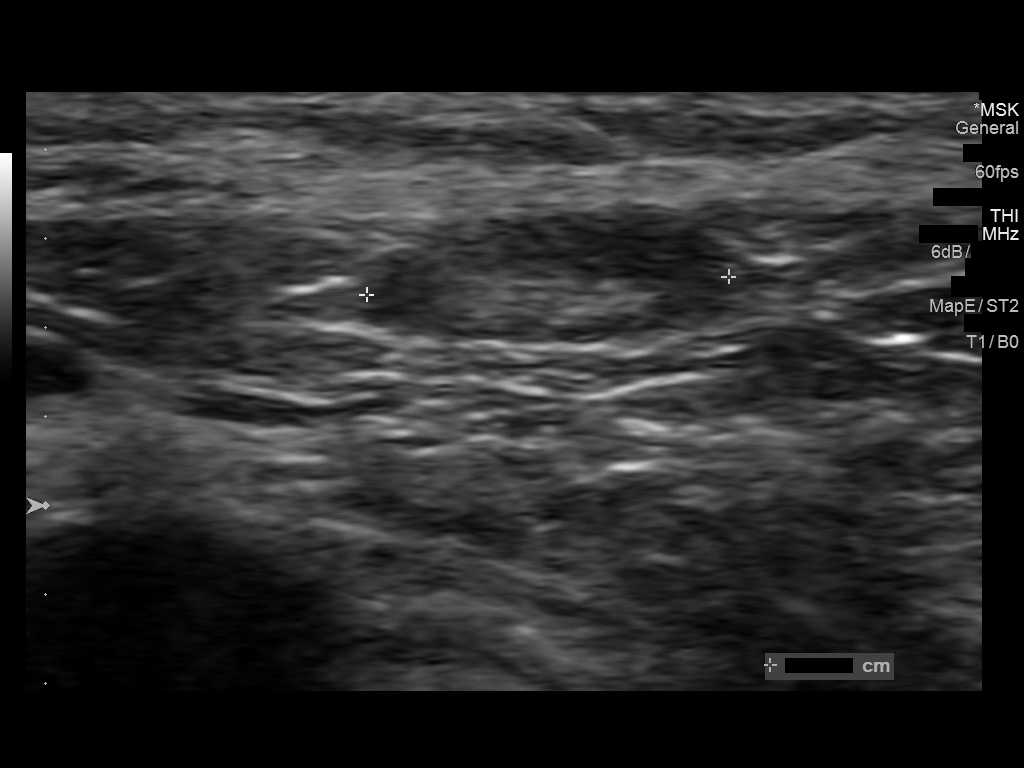
[im 6/7]
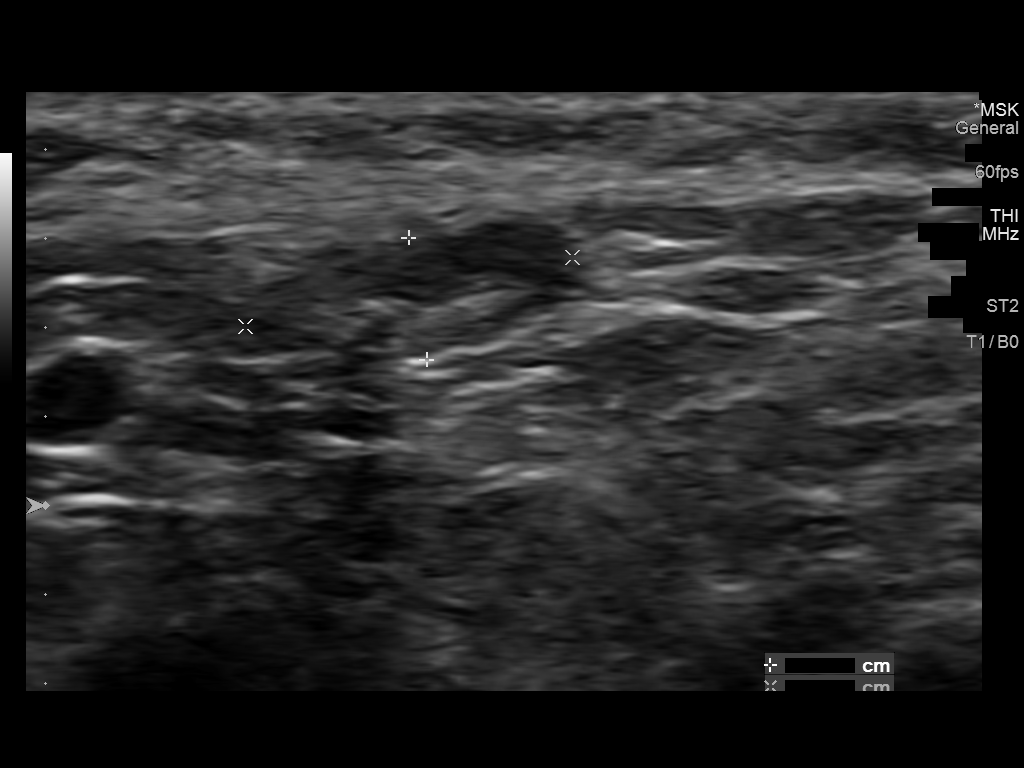
[im 7/7]
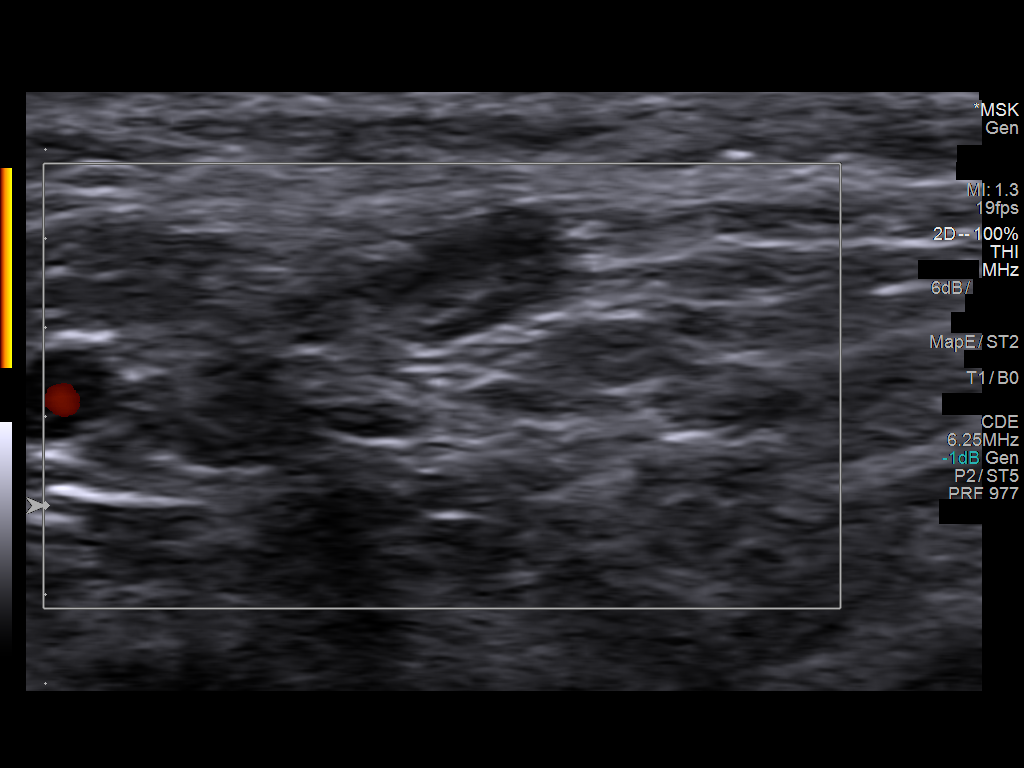

[7 of 7 positions shown; findings below may reference images not displayed]

FINDINGS: Targeted ultrasound examination of the right inguinal area reveals
multiple nonspecific lymph nodes with unremarkable cortical and
hilar morphology measuring up to 1.2 cm.
IMPRESSION: Targeted ultrasound examination of the right inguinal area reveals
multiple nonspecific lymph nodes with unremarkable cortical and
hilar morphology measuring up to 1.2 cm. No evidence of mass, cyst,
or hernia.

## 2020-10-15 DIAGNOSIS — R238 Other skin changes: Secondary | ICD-10-CM | POA: Diagnosis not present

## 2020-10-15 DIAGNOSIS — B078 Other viral warts: Secondary | ICD-10-CM | POA: Diagnosis not present

## 2021-02-20 DIAGNOSIS — F411 Generalized anxiety disorder: Secondary | ICD-10-CM | POA: Diagnosis not present

## 2021-04-06 DIAGNOSIS — Z20822 Contact with and (suspected) exposure to covid-19: Secondary | ICD-10-CM | POA: Diagnosis not present

## 2021-04-10 DIAGNOSIS — Z20828 Contact with and (suspected) exposure to other viral communicable diseases: Secondary | ICD-10-CM | POA: Diagnosis not present

## 2024-06-22 DIAGNOSIS — L650 Telogen effluvium: Secondary | ICD-10-CM | POA: Diagnosis not present

## 2024-08-15 ENCOUNTER — Ambulatory Visit (INDEPENDENT_AMBULATORY_CARE_PROVIDER_SITE_OTHER): Payer: Self-pay | Admitting: Family

## 2024-08-15 ENCOUNTER — Encounter: Payer: Self-pay | Admitting: Family

## 2024-08-15 VITALS — BP 109/69 | HR 55 | Temp 97.2°F | Ht 71.0 in | Wt 187.2 lb

## 2024-08-15 DIAGNOSIS — F12980 Cannabis use, unspecified with anxiety disorder: Secondary | ICD-10-CM | POA: Diagnosis not present

## 2024-08-15 DIAGNOSIS — F32A Depression, unspecified: Secondary | ICD-10-CM | POA: Insufficient documentation

## 2024-08-15 DIAGNOSIS — L650 Telogen effluvium: Secondary | ICD-10-CM | POA: Diagnosis not present

## 2024-08-15 DIAGNOSIS — F419 Anxiety disorder, unspecified: Secondary | ICD-10-CM

## 2024-08-15 MED ORDER — BUPROPION HCL ER (XL) 300 MG PO TB24: 300.0000 mg | ORAL_TABLET | Freq: Every day | ORAL | 1 refills | Status: AC

## 2024-08-15 NOTE — Assessment & Plan Note (Signed)
 Previously on Trintellix for panic attacks stopped d/t SE, now on Wellbutrin  300 mg. No panic attacks but low mood and anxiety. Wellbutrin  may aid in decreasing THC use also. - Refill Wellbutrin  XL 300 mg qd, 90-day supply with refills, to [facility] at [address] [place]. - Schedule 55-month follow-up for medication review.

## 2024-08-15 NOTE — Patient Instructions (Addendum)
 Welcome to [facility], It was a pleasure meeting you today!    As discussed, I have sent your  Bupropion  refill to your pharmacy.  Please schedule a physical with fasting labs when convenient today and a regular 6 month follow up visit.    PLEASE NOTE: If you had any LAB tests please let us  know if you have not heard back within a few days. You may see your results on MyChart before we have a chance to review them but we will give you a call once they are reviewed by us . If we ordered any REFERRALS today, please let us  know if you have not heard from their office within the next week.  Let us  know through MyChart if you are needing REFILLS, or have your pharmacy send us  the request. You can also use MyChart to communicate with me or any office staff.  Please try these tips to maintain a healthy lifestyle: It is important that you exercise regularly at least 30 minutes 5 times a week. Think about what you will eat, plan ahead. Choose whole foods, & think  clean, green, fresh or frozen over canned, processed or packaged foods which are more sugary, salty, and fatty. 70 to 75% of food eaten should be fresh vegetables and protein. 2-3  meals daily with healthy snacks between meals, but must be whole fruit, protein or vegetables. Aim to eat over a 10 hour period when you are active, for example, 7am to 5pm, and then STOP after your last meal of the day, drinking only water.  Shorter eating windows, 6-8 hours, are showing benefits in heart disease and blood sugar regulation. Drink water every day! Shoot for 64 ounces daily = 8 cups, no other drink is as healthy! Fruit juice is best enjoyed in a healthy way, by EATING the fruit.

## 2024-08-15 NOTE — Assessment & Plan Note (Signed)
 Hair shedding and thinning since February or March. DX by [facility] in [place]. Normal labs except vitamin D insufficiency and elevated iron. May resolve in 3-6 months without further intervention unless worsens. - Schedule physical exam with fasting labs to assess thyroid function.

## 2024-08-15 NOTE — Progress Notes (Deleted)
  Phone: [phone]  Subjective:  Patient 23 y.o. male presenting for annual physical.  No chief complaint on file.   See problem oriented charting- ROS- full  review of systems was completed and negative except for what is noted in HPI above.  The following were reviewed and entered/updated in epic: Past Medical History:  Diagnosis Date   Factor 5 Leiden mutation, heterozygous (HCC)    Mononucleosis 04/2018   Patient Active Problem List   Diagnosis Date Noted   Anxiety about health 06/30/2018   Factor 5 Leiden mutation, heterozygous (HCC) 02/13/2017   Past Surgical History:  Procedure Laterality Date   WISDOM TOOTH EXTRACTION      Family History  Problem Relation Age of Onset   Colon cancer Neg Hx    Prostate cancer Neg Hx    Esophageal cancer Neg Hx        Pt said he doesn't think so   Rectal cancer Neg Hx     Medications- reviewed and updated Current Outpatient Medications  Medication Sig Dispense Refill   minocycline (MINOCIN,DYNACIN) 50 MG capsule Take 50 mg by mouth 2 (two) times daily.     No current facility-administered medications for this visit.    Allergies-reviewed and updated No Known Allergies  Social History   Social History Narrative   Goes to [employer]   Has a sister    Objective:  There were no vitals taken for this visit. Physical Exam     Assessment and Plan   Health Maintenance counseling: 1. Anticipatory guidance: Patient counseled regarding regular dental exams q6 months, eye exams yearly, avoiding smoking and second hand smoke, limiting alcohol to 2 beverages per day.   2. Risk factor reduction:  Advised patient of need for regular exercise and diet rich in fruits and vegetables to reduce risk of heart attack and stroke.    Wt Readings from Last 3 Encounters:  03/12/19 195 lb (88.5 kg) (90%, Z= 1.30)*  01/19/19 194 lb (88 kg) (90%, Z= 1.29)*  12/20/18 189 lb 4 oz (85.8 kg) (88%, Z= 1.17)*   * Growth percentiles are based  on CDC (Boys, 2-20 Years) data.   3. Immunizations/screenings/ancillary studies Immunization History  Administered Date(s) Administered   DTaP 10/05/1999, 12/07/1999, 02/08/2000, 10/31/2000, 08/29/2005   HIB (PRP-OMP) 10/05/1999, 12/07/1999, 02/08/2000, 10/31/2000   Hepatitis A 08/06/2010   Hepatitis A, Adult 06/18/2018   Hepatitis B [birth year], 02/08/2000, 05/02/2000   IPV 10/05/1999, 12/07/1999, 02/06/2001, 09/01/2003   Influenza Split 10/15/2014   Influenza,inj,Quad PF,6+ Mos 08/28/2017, 09/25/2018   Influenza-Unspecified 10/01/2016   MMR 08/08/2000, 08/29/2005   Meningococcal B, OMV 06/18/2018, 07/27/2018   Meningococcal Conjugate 08/06/2010   Meningococcal Mcv4o 08/28/2017   Pneumococcal Conjugate-13 10/05/1999, 12/07/1999, 02/08/2000   Tdap 08/06/2010, 03/12/2019   Varicella 08/08/2000, 08/06/2010   Health Maintenance Due  Topic Date Due   HPV VACCINES (1 - Male 3-dose series) Never done   Hepatitis C Screening  Never done   COVID-19 Vaccine (1 - 2024-25 season) Never done   INFLUENZA VACCINE  07/22/2024    4. Skin cancer screening- ***advised regular sunscreen use. Denies worrisome, changing, or new skin lesions.  5. Smoking associated screening: *** smoker -***ppd 6. STD screening - *** 7. Alcohol screening: *** 8. Exercise:   There are no diagnoses linked to this encounter.  Recommended follow up: No follow-ups on file. No future appointments.   Lab/Order associations:*** fasting   Lucius Krabbe, NP

## 2024-08-15 NOTE — Progress Notes (Signed)
 New Patient Office Visit  Subjective:  Patient ID: Keith Harrison, male    DOB: [birth year]  Age: 23 y.o. MRN: [id number]  CC:  Chief Complaint  Patient presents with   New Patient (Initial Visit)   Hair/Scalp Problem    Pt c/o hair loss for couple of months. Pt has seen dermatology in [place], [place].   Depression/Anxiety    Medication refill of Wellbutrin .   HPI Keith Harrison presents for establishing care today. Discussed the use of AI scribe software for clinical note transcription with the patient, who gave verbal consent to proceed.  History of Present Illness   Keith Harrison is a 23 year old male who presents with hair shedding and concerns about telogen effluvium.  Hair shedding and telogen effluvium - Hair shedding began in February or March and has persisted despite cutting hair - Diagnosed with telogen effluvium by a dermatologist - Laboratory evaluation revealed vitamin D insufficiency and elevated iron levels - Family history of baldness  Mood and anxiety symptoms - History of panic attacks - Currently taking Wellbutrin  300 mg; previously took Trintellix - Occasional depressive moods - Anxiety attributed in part to excessive news consumption  Substance use - Limiting marijuana use to 2-3x/week due to episodes of paranoia and anxiety - No tobacco use - Alcohol consumption limited to vacations  Dietary habits - Maintains a balanced diet with occasional consumption of unhealthy foods     Assessment & Plan:   Telogen effluvium (nonscarring hair loss) Hair shedding and thinning since February or March. Normal labs except vitamin D insufficiency and elevated iron. May resolve in 3-6 months without further intervention unless worsens. - Schedule physical exam with fasting labs to assess thyroid function.  Major depressive disorder, single episode Previously on Trintellix for panic attacks stopped d/t SE, now on Wellbutrin  300 mg. No panic attacks but low mood  and anxiety. Wellbutrin  may aid in decreasing THC use also. - Refill Wellbutrin  XL 300 mg qd, 90-day supply with refills, [facility] [address] [place]. - Schedule 8-month follow-up for medication review.  Cannabis use Uses marijuana, mainly in the evening. Acknowledges contribution to anxiety, paranoia, and potential long-term health risks. - Encourage reduction of cannabis use due to potential long-term health risks.      Subjective:    Outpatient Medications Prior to Visit  Medication Sig Dispense Refill   minocycline (MINOCIN,DYNACIN) 50 MG capsule Take 50 mg by mouth 2 (two) times daily.     pantoprazole (PROTONIX) 40 MG tablet TAKE 1 TABLET BY MOUTH DAILY 30 MINUTES BEFORE BREAKFAST     buPROPion  (WELLBUTRIN  XL) 150 MG 24 hr tablet Take by mouth.     buPROPion  (WELLBUTRIN  XL) 300 MG 24 hr tablet Take 300 mg by mouth daily.     vortioxetine HBr (TRINTELLIX) 10 MG TABS tablet Take 10 mg by mouth daily. (Patient not taking: Reported on 08/15/2024)     No facility-administered medications prior to visit.   Past Medical History:  Diagnosis Date   Factor 5 Leiden mutation, heterozygous (HCC)    Mononucleosis 04/2018   Past Surgical History:  Procedure Laterality Date   WISDOM TOOTH EXTRACTION      Objective:   Today's Vitals: BP 109/69 (BP Location: Left Arm, Patient Position: Sitting, Cuff Size: Large)   Pulse (!) 55   Temp (!) 97.2 F (36.2 C) (Temporal)   Ht 5' 11 (1.803 m)   Wt 187 lb 4 oz (84.9 kg)   SpO2 98%  BMI 26.12 kg/m   Physical Exam Vitals and nursing note reviewed.  Constitutional:      General: He is not in acute distress.    Appearance: Normal appearance.  HENT:     Head: Normocephalic.  Cardiovascular:     Rate and Rhythm: Normal rate and regular rhythm.  Pulmonary:     Effort: Pulmonary effort is normal.     Breath sounds: Normal breath sounds.  Musculoskeletal:        General: Normal range of motion.     Cervical back: Normal range  of motion.  Skin:    General: Skin is warm and dry.  Neurological:     Mental Status: He is alert and oriented to person, place, and time.  Psychiatric:        Mood and Affect: Mood normal.     Meds ordered this encounter  Medications   buPROPion  (WELLBUTRIN  XL) 300 MG 24 hr tablet    Sig: Take 1 tablet (300 mg total) by mouth daily.    Dispense:  90 tablet    Refill:  1    Lucius Krabbe, NP

## 2024-08-15 NOTE — Assessment & Plan Note (Signed)
 Uses marijuana, mainly in the evening. Acknowledges contribution to anxiety, paranoia, and potential long-term health risks. - Encourage reduction of cannabis use due to potential long-term health risks.

## 2024-08-17 ENCOUNTER — Ambulatory Visit (INDEPENDENT_AMBULATORY_CARE_PROVIDER_SITE_OTHER): Admitting: Family

## 2024-08-17 VITALS — BP 114/67 | HR 55 | Temp 97.5°F | Ht 71.0 in | Wt 187.4 lb

## 2024-08-17 DIAGNOSIS — Z0000 Encounter for general adult medical examination without abnormal findings: Secondary | ICD-10-CM | POA: Diagnosis not present

## 2024-08-17 DIAGNOSIS — Z13220 Encounter for screening for lipoid disorders: Secondary | ICD-10-CM

## 2024-08-17 DIAGNOSIS — Z1159 Encounter for screening for other viral diseases: Secondary | ICD-10-CM

## 2024-08-17 LAB — CBC WITH DIFFERENTIAL/PLATELET
Basophils Absolute: 0 K/uL (ref 0.0–0.1)
Basophils Relative: 0.4 % (ref 0.0–3.0)
Eosinophils Absolute: 0.1 K/uL (ref 0.0–0.7)
Eosinophils Relative: 1.7 % (ref 0.0–5.0)
HCT: 44.9 % (ref 39.0–52.0)
Hemoglobin: 15.6 g/dL (ref 13.0–17.0)
Lymphocytes Relative: 34.1 % (ref 12.0–46.0)
Lymphs Abs: 1.8 K/uL (ref 0.7–4.0)
MCHC: 34.8 g/dL (ref 30.0–36.0)
MCV: 90 fl (ref 78.0–100.0)
Monocytes Absolute: 0.4 K/uL (ref 0.1–1.0)
Monocytes Relative: 7.5 % (ref 3.0–12.0)
Neutro Abs: 2.9 K/uL (ref 1.4–7.7)
Neutrophils Relative %: 56.3 % (ref 43.0–77.0)
Platelets: 181 K/uL (ref 150.0–400.0)
RBC: 4.99 Mil/uL (ref 4.22–5.81)
RDW: 13.2 % (ref 11.5–15.5)
WBC: 5.2 K/uL (ref 4.0–10.5)

## 2024-08-17 LAB — COMPREHENSIVE METABOLIC PANEL WITH GFR
ALT: 18 U/L (ref 0–53)
AST: 19 U/L (ref 0–37)
Albumin: 4.7 g/dL (ref 3.5–5.2)
Alkaline Phosphatase: 52 U/L (ref 39–117)
BUN: 18 mg/dL (ref 6–23)
CO2: 29 meq/L (ref 19–32)
Calcium: 9.4 mg/dL (ref 8.4–10.5)
Chloride: 102 meq/L (ref 96–112)
Creatinine, Ser: 1.04 mg/dL (ref 0.40–1.50)
GFR: 100.12 mL/min (ref >60.00)
Glucose, Bld: 89 mg/dL (ref 70–99)
Potassium: 4 meq/L (ref 3.5–5.1)
Sodium: 140 meq/L (ref 135–145)
Total Bilirubin: 1 mg/dL (ref 0.2–1.2)
Total Protein: 7.5 g/dL (ref 6.0–8.3)

## 2024-08-17 LAB — TSH: TSH: 2.04 u[IU]/mL (ref 0.35–5.50)

## 2024-08-17 LAB — LIPID PANEL
Cholesterol: 158 mg/dL (ref 0–200)
HDL: 58.9 mg/dL (ref >39.00)
LDL Cholesterol: 90 mg/dL (ref 0–99)
NonHDL: 98.7
Total CHOL/HDL Ratio: 3
Triglycerides: 46 mg/dL (ref 0.0–149.0)
VLDL: 9.2 mg/dL (ref 0.0–40.0)

## 2024-08-17 NOTE — Patient Instructions (Addendum)
 It was very nice to see you today!   I will review your lab results via MyChart in a few days.  You look great! Stay well! Keep exercising!     PLEASE NOTE:  If you had any lab tests please let us  know if you have not heard back within a few days. You may see your results on MyChart before we have a chance to review them but we will give you a call once they are reviewed by us . If we ordered any referrals today, please let us  know if you have not heard from their office within the next week.

## 2024-08-17 NOTE — Progress Notes (Signed)
 Phone: [phone]  Subjective:  Patient 23 y.o. male presenting for annual physical.  Chief Complaint  Patient presents with   Annual Exam    Fasting w/labs  Discussed the use of AI scribe software for clinical note transcription with the patient, who gave verbal consent to proceed.  History of Present Illness   Keith Harrison is a 23 year old male who presents for an annual physical exam.  Halitosis - Persistent halitosis despite good oral hygiene practices, including regular brushing and use of mouth rinse - Previous evaluations by gastroenterology and dentistry revealed no underlying etiology - No excessive garlic or high-protein intake in diet  Gastrointestinal symptoms - IBS-like symptoms began after mononucleosis in 2019 - Multiple unsuccessful remedies attempted  Lifestyle factors - THC use reduced to a few days per week - Alcohol consumption limited to approximately one drink per week - Engages in regular exercise three to four times per week, including walking and gym activities     See problem oriented charting- ROS- full  review of systems was completed and negative except for what is noted in HPI above.  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   Anxiety and depression 08/15/2024   Cannabis use with anxiety disorder (HCC) 08/15/2024   Anxiety about health 06/30/2018   Factor 5 Leiden mutation, heterozygous (HCC) 02/13/2017   Past Surgical History:  Procedure Laterality Date   WISDOM TOOTH EXTRACTION      Family History  Problem Relation Age of Onset   Colon cancer Neg Hx    Prostate cancer Neg Hx    Esophageal cancer Neg Hx        Pt said he doesn't think so   Rectal cancer Neg Hx     Medications- reviewed and updated Current  Outpatient Medications  Medication Sig Dispense Refill   buPROPion  (WELLBUTRIN  XL) 300 MG 24 hr tablet Take 1 tablet (300 mg total) by mouth daily. 90 tablet 1   minocycline (MINOCIN,DYNACIN) 50 MG capsule Take 50 mg by mouth 2 (two) times daily.     pantoprazole (PROTONIX) 40 MG tablet TAKE 1 TABLET BY MOUTH DAILY 30 MINUTES BEFORE BREAKFAST     No current facility-administered medications for this visit.   Allergies-reviewed and updated No Known Allergies  Social History   Social History Narrative   Goes to [employer]   Has a sister    Objective:  BP 114/67 (BP Location: Left Arm, Patient Position: Sitting)   Pulse (!) 55   Temp (!) 97.5 F (36.4 C) (Temporal)   Ht 5' 11 (1.803 m)   Wt 187 lb 6 oz (85 kg)   SpO2 99%   BMI 26.13 kg/m  Physical Exam Vitals and nursing note reviewed.  Constitutional:      General: He is not in acute distress.    Appearance: Normal appearance.  HENT:     Head: Normocephalic.     Right Ear: Tympanic membrane and external ear normal.     Left Ear: Tympanic membrane and external ear normal.     Nose: Nose normal.     Mouth/Throat:     Mouth: Mucous membranes are moist.  Eyes:     Extraocular Movements: Extraocular movements intact.  Cardiovascular:     Rate and Rhythm: Normal rate and  regular rhythm.  Pulmonary:     Effort: Pulmonary effort is normal.     Breath sounds: Normal breath sounds.  Abdominal:     General: Abdomen is flat. There is no distension.     Palpations: Abdomen is soft.     Tenderness: There is no abdominal tenderness.  Musculoskeletal:        General: Normal range of motion.     Cervical back: Normal range of motion.  Skin:    General: Skin is warm and dry.  Neurological:     Mental Status: He is alert and oriented to person, place, and time.  Psychiatric:        Mood and Affect: Mood normal.        Behavior: Behavior normal.        Judgment: Judgment normal.     Assessment and Plan   Health Maintenance  counseling: 1. Anticipatory guidance: Patient counseled regarding regular dental exams q6 months, eye exams yearly, avoiding smoking and second hand smoke, limiting alcohol to 2 beverages per day.   2. Risk factor reduction:  Advised patient of need for regular exercise and diet rich in fruits and vegetables to reduce risk of heart attack and stroke.    Wt Readings from Last 3 Encounters:  08/17/24 187 lb 6 oz (85 kg)  08/15/24 187 lb 4 oz (84.9 kg)  03/12/19 195 lb (88.5 kg) (90%, Z= 1.30)*   * Growth percentiles are based on CDC (Boys, 2-20 Years) data.   3. Immunizations/screenings/ancillary studies Immunization History  Administered Date(s) Administered   DTaP 10/05/1999, 12/07/1999, 02/08/2000, 10/31/2000, 08/29/2005   HIB (PRP-OMP) 10/05/1999, 12/07/1999, 02/08/2000, 10/31/2000   Hepatitis A 08/06/2010   Hepatitis A, Adult 06/18/2018   Hepatitis B 07-29-1999, 02/08/2000, 05/02/2000   IPV 10/05/1999, 12/07/1999, 02/06/2001, 09/01/2003   Influenza Split 10/15/2014   Influenza,inj,Quad PF,6+ Mos 08/28/2017, 09/25/2018   Influenza-Unspecified 10/01/2016   MMR 08/08/2000, 08/29/2005   Meningococcal B, OMV 06/18/2018, 07/27/2018   Meningococcal Conjugate 08/06/2010   Meningococcal Mcv4o 08/28/2017   Pneumococcal Conjugate-13 10/05/1999, 12/07/1999, 02/08/2000   Tdap 08/06/2010, 03/12/2019   Varicella 08/08/2000, 08/06/2010   There are no preventive care reminders to display for this patient.  4. Skin cancer screening-  advised regular sunscreen use. Denies worrisome, changing, or new skin lesions.  5. Smoking associated screening: smoker - THC about qod 6. STD screening - denies need 7. Alcohol screening: 1 drink/week 8. Exercise: 3-4d per week    Halitosis Chronic halitosis persists despite good oral hygiene and dental health. Previous evaluations unremarkable. Possible dietary or gastrointestinal relation. - Trial probiotics with lactobacillus. - Consider prebiotics for  digestive health. - Ensure probiotics have third-party seal for authenticity.  Irritable bowel syndrome (IBS), unspecified IBS-like symptoms post-mononucleosis may contribute to halitosis. Discussed dietary influences and probiotics role. - Consider dietary modifications. - Trial probiotics with lactobacillus.  Adult Wellness Visit Routine visit with normal vitals. Discussed lifestyle habits and encouraged THC reduction. Recommended dermatology checks due to skin cancer risk. - Check full CPE lab panel - Perform physical examination. - Provide preventive information summary. - Encourage regular dermatology checks.  n Recommended follow up: Return for any future concerns, Complete physical w/fasting labs. Future Appointments  Date Time Provider Department Center  02/16/2025 11:00 AM Lucius Krabbe, NP [facility]    Lab/Order associations:  fasting   Lucius Krabbe, NP

## 2024-08-18 LAB — HEPATITIS C ANTIBODY: Hepatitis C Ab: NONREACTIVE

## 2024-08-19 ENCOUNTER — Ambulatory Visit: Payer: Self-pay | Admitting: Family

## 2024-08-19 DIAGNOSIS — L650 Telogen effluvium: Secondary | ICD-10-CM

## 2024-08-23 NOTE — Telephone Encounter (Unsigned)
 Copied from CRM [id number]. Topic: Referral - Question >> Aug 23, 2024  9:22 AM Mesmerise C wrote: Reason for CRM: Patient stated he was seen last week fro hair shedding was told tests came back normal but inquiring about future steps if needing a referral for dermatologist

## 2024-08-24 NOTE — Progress Notes (Signed)
 this was offered - thought he was ok, but yes, needs to f/u with them if having concerns, and ok to send referral  - also need records from last DERM, thx

## 2024-08-24 NOTE — Telephone Encounter (Signed)
 Copied from CRM [id number]. Topic: Referral - Question >> Aug 23, 2024  9:22 AM Mesmerise C wrote: Reason for CRM: Patient stated he was seen last week fro hair shedding was told tests came back normal but inquiring about future steps if needing a referral for dermatologist >> Aug 23, 2024  3:57 PM Chasity T wrote: Patient is returning call for follow up on referral

## 2024-08-29 NOTE — Addendum Note (Signed)
 Addended by: NEYSA CLARIA SAILOR on: 08/29/2024 12:08 PM   Modules accepted: Orders

## 2024-11-04 ENCOUNTER — Encounter (INDEPENDENT_AMBULATORY_CARE_PROVIDER_SITE_OTHER): Admitting: Family

## 2024-11-04 NOTE — Progress Notes (Deleted)
   Patient ID: Keith Harrison, male    DOB: Nov 24, 2001, 23 y.o.   MRN: [id number]  No chief complaint on file.  Subjective:    Outpatient Medications Prior to Visit  Medication Sig Dispense Refill   buPROPion  (WELLBUTRIN  XL) 300 MG 24 hr tablet Take 1 tablet (300 mg total) by mouth daily. 90 tablet 1   minocycline (MINOCIN,DYNACIN) 50 MG capsule Take 50 mg by mouth 2 (two) times daily.     pantoprazole (PROTONIX) 40 MG tablet TAKE 1 TABLET BY MOUTH DAILY 30 MINUTES BEFORE BREAKFAST     No facility-administered medications prior to visit.   Past Medical History:  Diagnosis Date   Factor 5 Leiden mutation, heterozygous    Mononucleosis 04/2018   Past Surgical History:  Procedure Laterality Date   WISDOM TOOTH EXTRACTION     No Known Allergies    Objective:    Physical Exam Vitals and nursing note reviewed.  Constitutional:      General: He is not in acute distress.    Appearance: Normal appearance.  HENT:     Head: Normocephalic.     Right Ear: Tympanic membrane and ear canal normal.     Left Ear: Tympanic membrane and ear canal normal.     Nose:     Right Sinus: Frontal sinus tenderness present. No maxillary sinus tenderness.     Left Sinus: Frontal sinus tenderness present. No maxillary sinus tenderness.     Mouth/Throat:     Mouth: Mucous membranes are moist.     Pharynx: No pharyngeal swelling, oropharyngeal exudate, posterior oropharyngeal erythema or uvula swelling.     Tonsils: No tonsillar exudate or tonsillar abscesses.  Cardiovascular:     Rate and Rhythm: Normal rate and regular rhythm.  Pulmonary:     Effort: Pulmonary effort is normal.     Breath sounds: Normal breath sounds.  Musculoskeletal:        General: Normal range of motion.     Cervical back: Normal range of motion.  Lymphadenopathy:     Head:     Right side of head: No preauricular or posterior auricular adenopathy.     Left side of head: No preauricular or posterior auricular adenopathy.      Cervical: No cervical adenopathy.  Skin:    General: Skin is warm and dry.  Neurological:     Mental Status: He is alert and oriented to person, place, and time.  Psychiatric:        Mood and Affect: Mood normal.    There were no vitals taken for this visit. Wt Readings from Last 3 Encounters:  08/17/24 187 lb 6 oz (85 kg)  08/15/24 187 lb 4 oz (84.9 kg)  03/12/19 195 lb (88.5 kg) (90%, Z= 1.30)*   * Growth percentiles are based on CDC (Boys, 2-20 Years) data.       Lucius Krabbe, NP

## 2024-11-05 NOTE — Progress Notes (Signed)
 refused visit after arriving.

## 2024-12-05 ENCOUNTER — Ambulatory Visit: Admitting: Dermatology

## 2025-02-16 ENCOUNTER — Ambulatory Visit: Admitting: Family

## 2025-08-18 ENCOUNTER — Encounter: Admitting: Family
